# Patient Record
Sex: Female | Born: 1965 | Race: White | Hispanic: No | Marital: Married | Smoking: Never smoker
Health system: Southern US, Community
[De-identification: ages and names within clinical notes are randomized; demographics above are authoritative.]

## PROBLEM LIST (undated history)

## (undated) HISTORY — PX: CHOLECYSTECTOMY: SHX55

---

## 2010-12-27 ENCOUNTER — Other Ambulatory Visit (HOSPITAL_COMMUNITY)
Admission: RE | Admit: 2010-12-27 | Discharge: 2010-12-27 | Disposition: A | Discharge status: Home / Self Care | Payer: PRIVATE HEALTH INSURANCE | Source: Ambulatory Visit | Attending: Family Medicine | Admitting: Family Medicine

## 2010-12-27 DIAGNOSIS — Z124 Encounter for screening for malignant neoplasm of cervix: Secondary | ICD-10-CM | POA: Insufficient documentation

## 2010-12-28 ENCOUNTER — Other Ambulatory Visit (HOSPITAL_COMMUNITY): Payer: Self-pay | Admitting: Family Medicine

## 2010-12-28 DIAGNOSIS — Z1231 Encounter for screening mammogram for malignant neoplasm of breast: Secondary | ICD-10-CM

## 2011-01-15 ENCOUNTER — Ambulatory Visit (HOSPITAL_COMMUNITY): Payer: PRIVATE HEALTH INSURANCE

## 2011-02-05 ENCOUNTER — Ambulatory Visit (HOSPITAL_COMMUNITY)
Admission: RE | Admit: 2011-02-05 | Discharge: 2011-02-05 | Disposition: A | Discharge status: Home / Self Care | Payer: PRIVATE HEALTH INSURANCE | Source: Ambulatory Visit | Attending: Family Medicine | Admitting: Family Medicine

## 2011-02-05 DIAGNOSIS — Z1231 Encounter for screening mammogram for malignant neoplasm of breast: Secondary | ICD-10-CM | POA: Insufficient documentation

## 2012-02-18 ENCOUNTER — Other Ambulatory Visit: Payer: Self-pay | Admitting: Family Medicine

## 2012-02-18 DIAGNOSIS — Z1231 Encounter for screening mammogram for malignant neoplasm of breast: Secondary | ICD-10-CM

## 2012-03-06 ENCOUNTER — Ambulatory Visit (HOSPITAL_COMMUNITY)
Admission: RE | Admit: 2012-03-06 | Discharge: 2012-03-06 | Disposition: A | Discharge status: Home / Self Care | Payer: PRIVATE HEALTH INSURANCE | Source: Ambulatory Visit | Attending: Family Medicine | Admitting: Family Medicine

## 2012-03-06 DIAGNOSIS — Z1231 Encounter for screening mammogram for malignant neoplasm of breast: Secondary | ICD-10-CM | POA: Insufficient documentation

## 2012-10-21 ENCOUNTER — Other Ambulatory Visit (HOSPITAL_COMMUNITY)
Admission: RE | Admit: 2012-10-21 | Discharge: 2012-10-21 | Disposition: A | Discharge status: Home / Self Care | Payer: PRIVATE HEALTH INSURANCE | Source: Ambulatory Visit | Attending: Family Medicine | Admitting: Family Medicine

## 2012-10-21 DIAGNOSIS — Z113 Encounter for screening for infections with a predominantly sexual mode of transmission: Secondary | ICD-10-CM | POA: Insufficient documentation

## 2012-10-21 DIAGNOSIS — Z124 Encounter for screening for malignant neoplasm of cervix: Secondary | ICD-10-CM | POA: Insufficient documentation

## 2014-11-19 HISTORY — PX: BREAST BIOPSY: SHX20

## 2014-12-09 ENCOUNTER — Other Ambulatory Visit (HOSPITAL_COMMUNITY)
Admission: RE | Admit: 2014-12-09 | Discharge: 2014-12-09 | Disposition: A | Discharge status: Home / Self Care | Payer: 59 | Source: Ambulatory Visit | Attending: Family Medicine | Admitting: Family Medicine

## 2014-12-09 DIAGNOSIS — Z1151 Encounter for screening for human papillomavirus (HPV): Secondary | ICD-10-CM | POA: Diagnosis not present

## 2014-12-09 DIAGNOSIS — Z124 Encounter for screening for malignant neoplasm of cervix: Secondary | ICD-10-CM | POA: Insufficient documentation

## 2017-09-16 ENCOUNTER — Other Ambulatory Visit: Payer: Self-pay | Admitting: Obstetrics & Gynecology

## 2017-09-16 DIAGNOSIS — Z1231 Encounter for screening mammogram for malignant neoplasm of breast: Secondary | ICD-10-CM

## 2017-10-07 ENCOUNTER — Ambulatory Visit
Admission: RE | Admit: 2017-10-07 | Discharge: 2017-10-07 | Disposition: A | Discharge status: Home / Self Care | Payer: BLUE CROSS/BLUE SHIELD | Source: Ambulatory Visit | Attending: Obstetrics & Gynecology | Admitting: Obstetrics & Gynecology

## 2017-10-07 ENCOUNTER — Encounter: Payer: Self-pay | Admitting: Radiology

## 2017-10-07 DIAGNOSIS — Z1231 Encounter for screening mammogram for malignant neoplasm of breast: Secondary | ICD-10-CM

## 2017-10-14 ENCOUNTER — Inpatient Hospital Stay
Admission: RE | Admit: 2017-10-14 | Discharge: 2017-10-14 | Disposition: A | Discharge status: Home / Self Care | Payer: Self-pay | Source: Ambulatory Visit | Attending: *Deleted | Admitting: *Deleted

## 2017-10-14 ENCOUNTER — Other Ambulatory Visit: Payer: Self-pay | Admitting: *Deleted

## 2017-10-14 DIAGNOSIS — Z9289 Personal history of other medical treatment: Secondary | ICD-10-CM

## 2017-10-16 ENCOUNTER — Other Ambulatory Visit: Payer: Self-pay | Admitting: Obstetrics & Gynecology

## 2017-10-16 DIAGNOSIS — R928 Other abnormal and inconclusive findings on diagnostic imaging of breast: Secondary | ICD-10-CM

## 2017-10-16 DIAGNOSIS — N6489 Other specified disorders of breast: Secondary | ICD-10-CM

## 2017-10-17 ENCOUNTER — Ambulatory Visit
Admission: RE | Admit: 2017-10-17 | Discharge: 2017-10-17 | Disposition: A | Discharge status: Home / Self Care | Payer: BLUE CROSS/BLUE SHIELD | Source: Ambulatory Visit | Attending: Obstetrics & Gynecology | Admitting: Obstetrics & Gynecology

## 2017-10-17 DIAGNOSIS — N6489 Other specified disorders of breast: Secondary | ICD-10-CM

## 2017-10-17 DIAGNOSIS — R928 Other abnormal and inconclusive findings on diagnostic imaging of breast: Secondary | ICD-10-CM

## 2018-10-26 IMAGING — MG MM DIGITAL DIAGNOSTIC UNILAT*R* W/ TOMO W/ CAD
6 series · 6 of 14 positions shown · non-contrast
Comparison: Previous exam(s).

CLINICAL DATA: The patient was called back for a right breast
asymmetry.

EXAM:
2D DIGITAL DIAGNOSTIC RIGHT MAMMOGRAM WITH ADJUNCT TOMO
ULTRASOUND RIGHT BREAST

[R CC synth-2D]
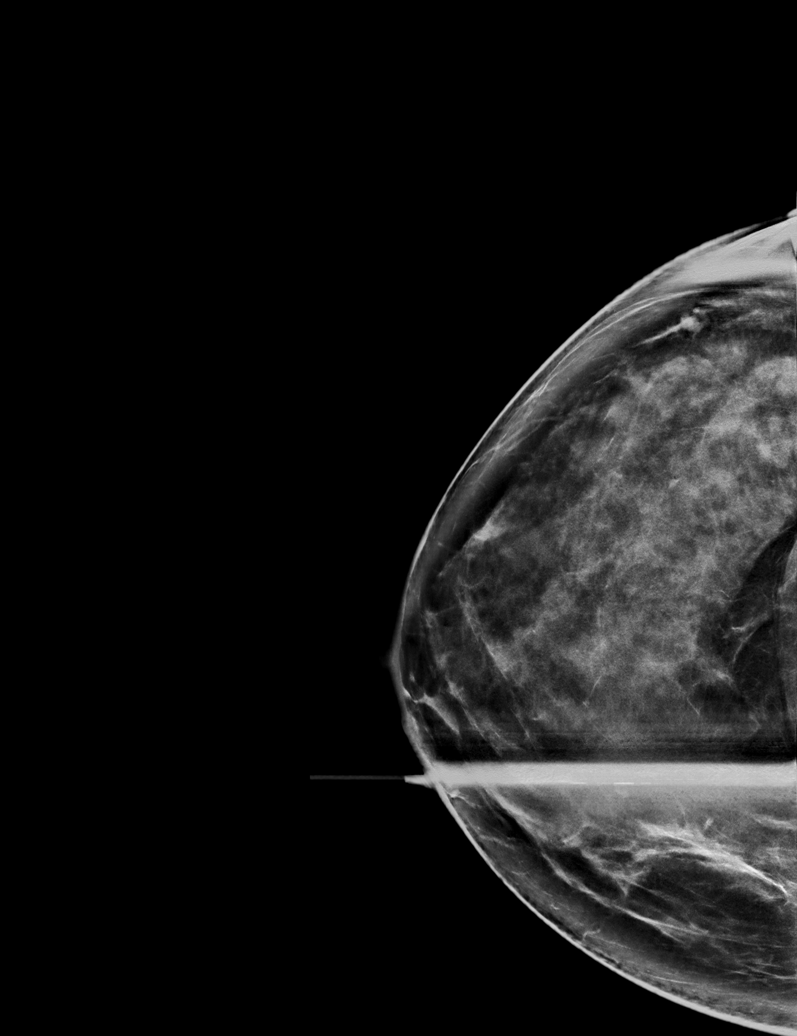

[R CC]
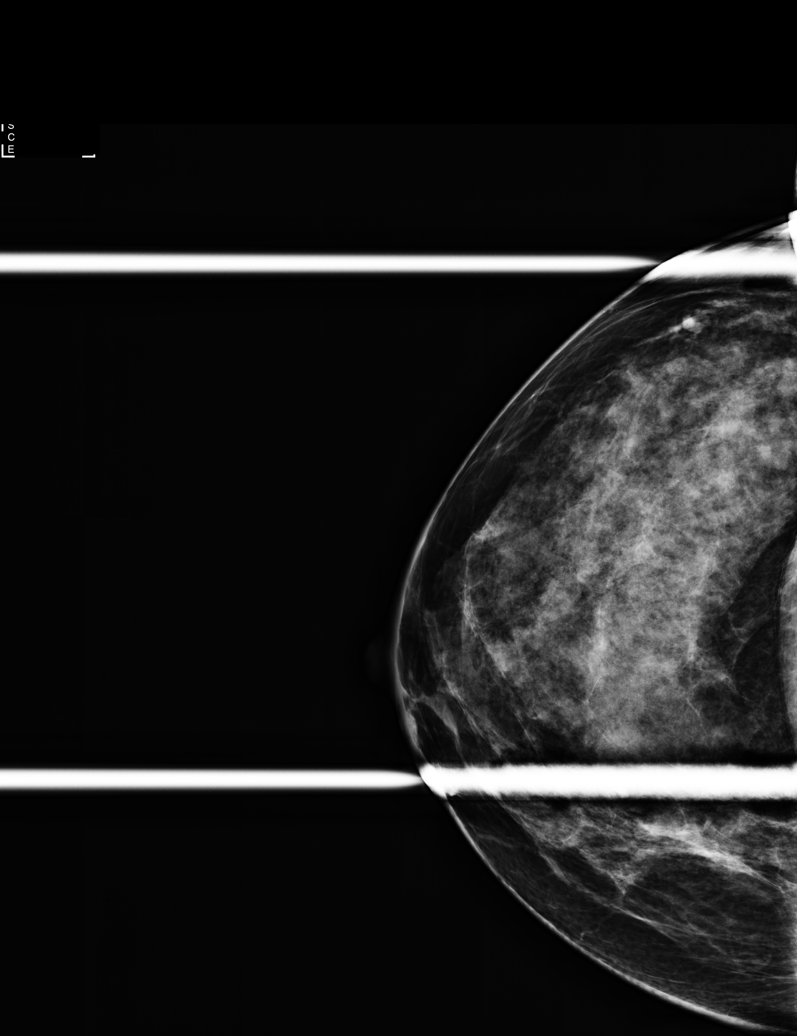

[R ML synth-2D]
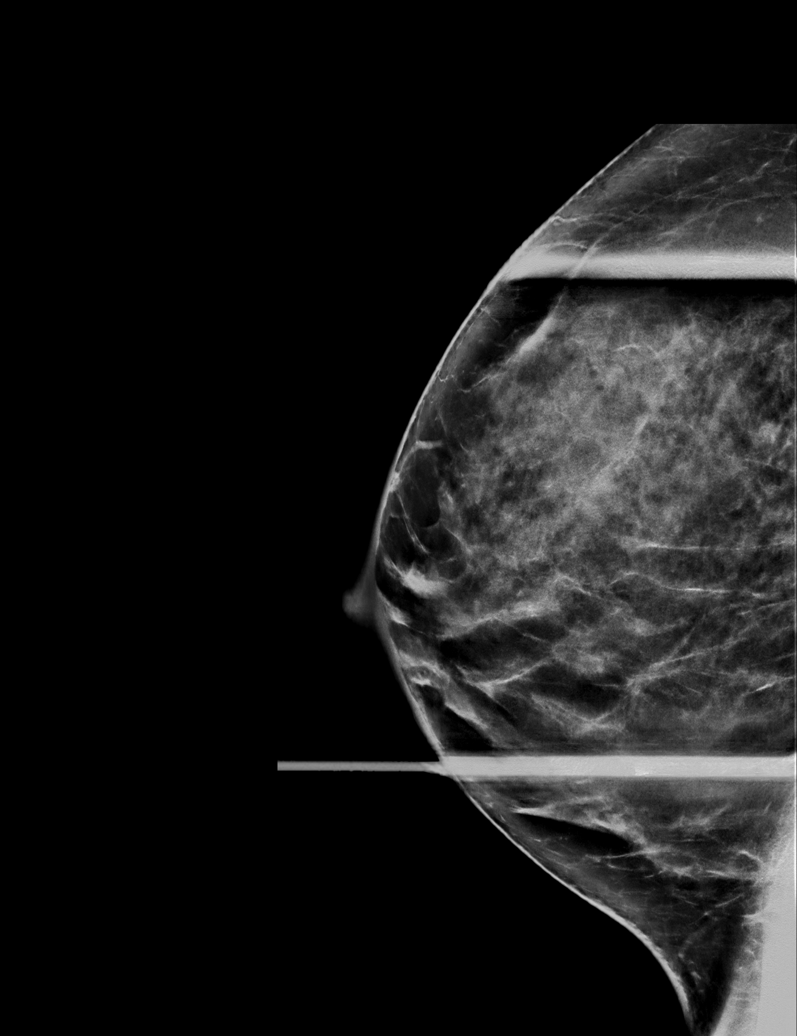

[R ML]
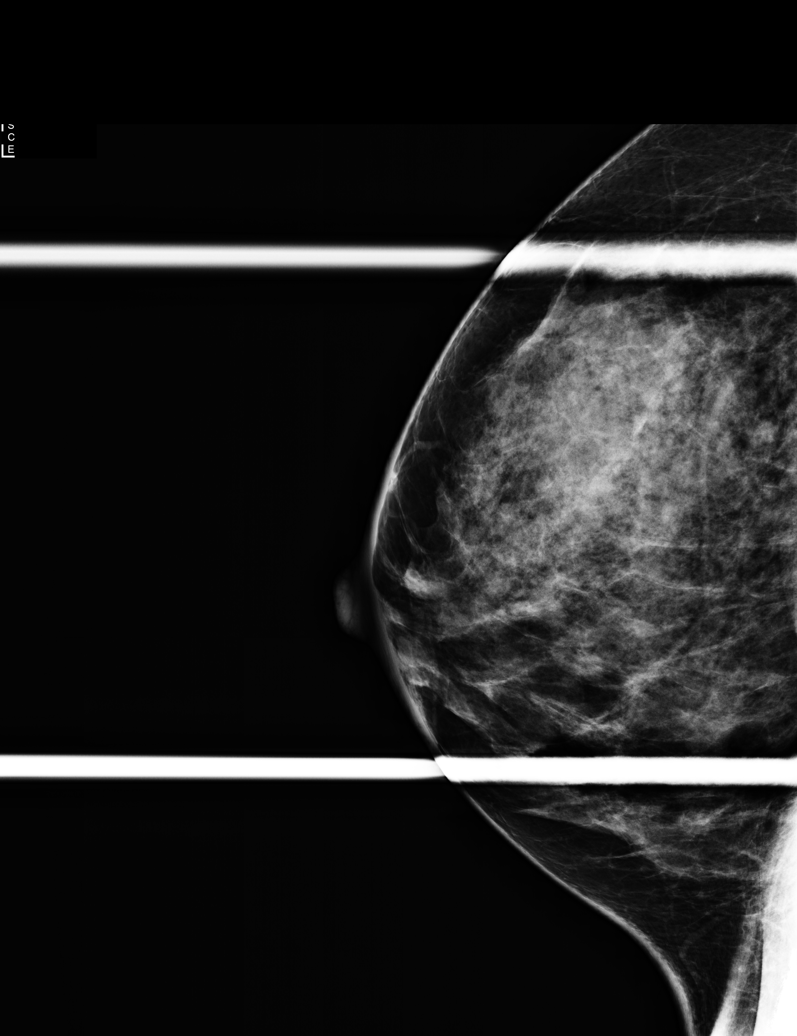

[R CC tomo · tomo slice 41/80.0]
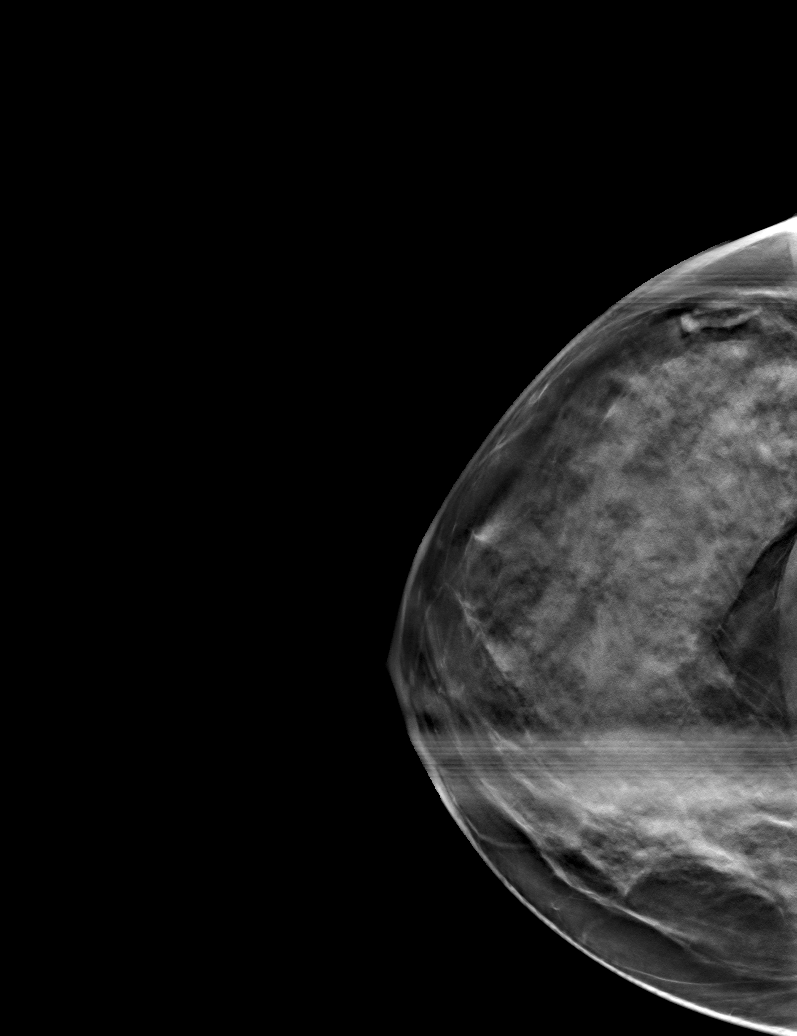

[R ML tomo · tomo slice 37/74.0]
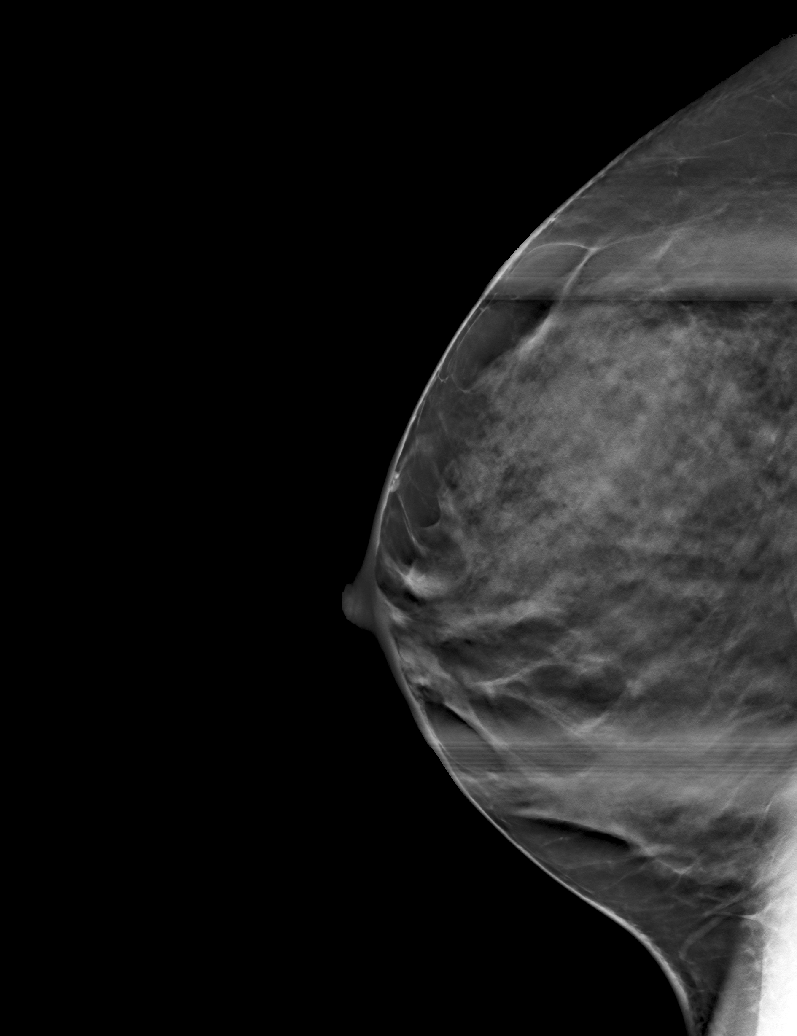

[6 of 14 positions shown; findings below may reference images not displayed]

ACR Breast Density Category c: The breast tissue is heterogeneously
dense, which may obscure small masses.
FINDINGS: The right breast asymmetry resolves on additional imaging.

On physical exam, no suspicious lumps are identified.

Targeted ultrasound is performed, showing no sonographic
abnormalities are identified in the upper outer right breast.
IMPRESSION: No mammographic or sonographic evidence of malignancy.

RECOMMENDATION:
Annual screening mammography.

I have discussed the findings and recommendations with the patient.
Results were also provided in writing at the conclusion of the
visit. If applicable, a reminder letter will be sent to the patient
regarding the next appointment.

BI-RADS CATEGORY  2: Benign.

## 2019-08-14 ENCOUNTER — Other Ambulatory Visit: Payer: Self-pay | Admitting: Obstetrics & Gynecology

## 2019-08-14 DIAGNOSIS — Z1231 Encounter for screening mammogram for malignant neoplasm of breast: Secondary | ICD-10-CM

## 2019-09-18 ENCOUNTER — Ambulatory Visit
Admission: RE | Admit: 2019-09-18 | Discharge: 2019-09-18 | Disposition: A | Discharge status: Home / Self Care | Payer: 59 | Source: Ambulatory Visit | Attending: Obstetrics & Gynecology | Admitting: Obstetrics & Gynecology

## 2019-09-18 DIAGNOSIS — Z1231 Encounter for screening mammogram for malignant neoplasm of breast: Secondary | ICD-10-CM | POA: Diagnosis not present

## 2019-09-22 ENCOUNTER — Other Ambulatory Visit: Payer: Self-pay | Admitting: Obstetrics & Gynecology

## 2019-09-22 DIAGNOSIS — N6320 Unspecified lump in the left breast, unspecified quadrant: Secondary | ICD-10-CM

## 2019-09-22 DIAGNOSIS — R928 Other abnormal and inconclusive findings on diagnostic imaging of breast: Secondary | ICD-10-CM

## 2019-09-30 ENCOUNTER — Ambulatory Visit
Admission: RE | Admit: 2019-09-30 | Discharge: 2019-09-30 | Disposition: A | Discharge status: Home / Self Care | Payer: 59 | Source: Ambulatory Visit | Attending: Obstetrics & Gynecology | Admitting: Obstetrics & Gynecology

## 2019-09-30 DIAGNOSIS — R928 Other abnormal and inconclusive findings on diagnostic imaging of breast: Secondary | ICD-10-CM

## 2019-09-30 DIAGNOSIS — N6320 Unspecified lump in the left breast, unspecified quadrant: Secondary | ICD-10-CM

## 2019-10-06 ENCOUNTER — Other Ambulatory Visit: Payer: Self-pay | Admitting: Obstetrics & Gynecology

## 2019-10-06 DIAGNOSIS — N6320 Unspecified lump in the left breast, unspecified quadrant: Secondary | ICD-10-CM

## 2020-02-11 ENCOUNTER — Ambulatory Visit: Payer: PRIVATE HEALTH INSURANCE | Attending: Internal Medicine

## 2020-02-11 DIAGNOSIS — Z23 Encounter for immunization: Secondary | ICD-10-CM

## 2020-02-11 NOTE — Progress Notes (Signed)
   Covid-19 Vaccination Clinic  Name:  Mary Cowan    MRN: [id number] DOB: 09/22/1966  02/11/2020  [name] was observed post Covid-19 immunization for 15 minutes without incident. She was provided with Vaccine Information Sheet and instruction to access the V-Safe system.   [name] was instructed to call 911 with any severe reactions post vaccine: Marland Kitchen Difficulty breathing  . Swelling of face and throat  . A fast heartbeat  . A bad rash all over body  . Dizziness and weakness   Immunizations Administered    Name Date Dose VIS Date Route   Moderna COVID-19 Vaccine 02/11/2020  8:51 AM 0.5 mL 10/20/2019 Intramuscular   Manufacturer: Moderna   Lot: [id number]   NDC: 58099-833-82

## 2020-03-15 ENCOUNTER — Ambulatory Visit: Payer: PRIVATE HEALTH INSURANCE | Attending: Internal Medicine

## 2020-03-15 DIAGNOSIS — Z23 Encounter for immunization: Secondary | ICD-10-CM

## 2020-03-15 NOTE — Progress Notes (Signed)
   Covid-19 Vaccination Clinic  Name:  Mary Cowan    MRN: [id number] DOB: October 28, 1966  03/15/2020  [name] was observed post Covid-19 immunization for 15 minutes without incident. She was provided with Vaccine Information Sheet and instruction to access the V-Safe system.   [name] was instructed to call 911 with any severe reactions post vaccine: Marland Kitchen Difficulty breathing  . Swelling of face and throat  . A fast heartbeat  . A bad rash all over body  . Dizziness and weakness   Immunizations Administered    Name Date Dose VIS Date Route   Moderna COVID-19 Vaccine 03/15/2020  9:18 AM 0.5 mL 10/2019 Intramuscular   Manufacturer: Moderna   Lot: [id number]   NDC: 52481-859-09

## 2020-05-20 ENCOUNTER — Ambulatory Visit
Admission: RE | Admit: 2020-05-20 | Discharge: 2020-05-20 | Disposition: A | Discharge status: Home / Self Care | Payer: 59 | Source: Ambulatory Visit | Attending: Obstetrics & Gynecology | Admitting: Obstetrics & Gynecology

## 2020-05-20 DIAGNOSIS — N6320 Unspecified lump in the left breast, unspecified quadrant: Secondary | ICD-10-CM | POA: Diagnosis not present

## 2021-04-10 ENCOUNTER — Inpatient Hospital Stay: Payer: 59 | Admitting: Anesthesiology

## 2021-04-10 ENCOUNTER — Encounter: Payer: Self-pay | Admitting: Emergency Medicine

## 2021-04-10 ENCOUNTER — Observation Stay
Admission: EM | Admit: 2021-04-10 | Discharge: 2021-04-11 | Disposition: A | Discharge status: Home / Self Care | Payer: 59 | Attending: Surgery | Admitting: Surgery

## 2021-04-10 ENCOUNTER — Other Ambulatory Visit: Payer: Self-pay

## 2021-04-10 ENCOUNTER — Emergency Department: Payer: 59

## 2021-04-10 ENCOUNTER — Inpatient Hospital Stay: Payer: 59

## 2021-04-10 ENCOUNTER — Encounter
Admission: EM | Disposition: A | Discharge status: Home / Self Care | Payer: Self-pay | Source: Home / Self Care | Attending: Emergency Medicine

## 2021-04-10 DIAGNOSIS — K8000 Calculus of gallbladder with acute cholecystitis without obstruction: Secondary | ICD-10-CM | POA: Diagnosis not present

## 2021-04-10 DIAGNOSIS — Z20822 Contact with and (suspected) exposure to covid-19: Secondary | ICD-10-CM | POA: Diagnosis not present

## 2021-04-10 DIAGNOSIS — R17 Unspecified jaundice: Secondary | ICD-10-CM | POA: Diagnosis not present

## 2021-04-10 DIAGNOSIS — R1011 Right upper quadrant pain: Secondary | ICD-10-CM | POA: Diagnosis present

## 2021-04-10 DIAGNOSIS — K819 Cholecystitis, unspecified: Secondary | ICD-10-CM | POA: Diagnosis present

## 2021-04-10 DIAGNOSIS — K8010 Calculus of gallbladder with chronic cholecystitis without obstruction: Secondary | ICD-10-CM | POA: Diagnosis not present

## 2021-04-10 LAB — CBC WITH DIFFERENTIAL/PLATELET
Abs Immature Granulocytes: 0.07 10*3/uL (ref 0.00–0.07)
Basophils Absolute: 0.1 10*3/uL (ref 0.0–0.1)
Basophils Relative: 1 %
Eosinophils Absolute: 0.2 10*3/uL (ref 0.0–0.5)
Eosinophils Relative: 1 %
HCT: 42.6 % (ref 36.0–46.0)
Hemoglobin: 14.6 g/dL (ref 12.0–15.0)
Immature Granulocytes: 0 %
Lymphocytes Relative: 8 %
Lymphs Abs: 1.5 10*3/uL (ref 0.7–4.0)
MCH: 30.2 pg (ref 26.0–34.0)
MCHC: 34.3 g/dL (ref 30.0–36.0)
MCV: 88 fL (ref 80.0–100.0)
Monocytes Absolute: 2.2 10*3/uL — ABNORMAL HIGH (ref 0.1–1.0)
Monocytes Relative: 11 %
Neutro Abs: 14.9 10*3/uL — ABNORMAL HIGH (ref 1.7–7.7)
Neutrophils Relative %: 79 %
Platelets: 538 10*3/uL — ABNORMAL HIGH (ref 150–400)
RBC: 4.84 MIL/uL (ref 3.87–5.11)
RDW: 12.9 % (ref 11.5–15.5)
WBC: 18.9 10*3/uL — ABNORMAL HIGH (ref 4.0–10.5)
nRBC: 0 % (ref 0.0–0.2)

## 2021-04-10 LAB — URINALYSIS, COMPLETE (UACMP) WITH MICROSCOPIC
Glucose, UA: NEGATIVE mg/dL
Hgb urine dipstick: NEGATIVE
Ketones, ur: 5 mg/dL — AB
Leukocytes,Ua: NEGATIVE
Nitrite: NEGATIVE
Protein, ur: >=300 mg/dL — AB
Specific Gravity, Urine: 1.031 — ABNORMAL HIGH (ref 1.005–1.030)
pH: 6 (ref 5.0–8.0)

## 2021-04-10 LAB — BASIC METABOLIC PANEL
Anion gap: 12 (ref 5–15)
BUN: 11 mg/dL (ref 6–20)
CO2: 27 mmol/L (ref 22–32)
Calcium: 9.7 mg/dL (ref 8.9–10.3)
Chloride: 99 mmol/L (ref 98–111)
Creatinine, Ser: 0.71 mg/dL (ref 0.44–1.00)
GFR, Estimated: >60 mL/min (ref >60)
Glucose, Bld: 116 mg/dL — ABNORMAL HIGH (ref 70–99)
Potassium: 4.2 mmol/L (ref 3.5–5.1)
Sodium: 138 mmol/L (ref 135–145)

## 2021-04-10 LAB — HEPATIC FUNCTION PANEL
ALT: 52 U/L — ABNORMAL HIGH (ref 0–44)
AST: 37 U/L (ref 15–41)
Albumin: 4.3 g/dL (ref 3.5–5.0)
Alkaline Phosphatase: 72 U/L (ref 38–126)
Bilirubin, Direct: 0.9 mg/dL — ABNORMAL HIGH (ref 0.0–0.2)
Indirect Bilirubin: 3 mg/dL — ABNORMAL HIGH (ref 0.3–0.9)
Total Bilirubin: 3.9 mg/dL — ABNORMAL HIGH (ref 0.3–1.2)
Total Protein: 8.5 g/dL — ABNORMAL HIGH (ref 6.5–8.1)

## 2021-04-10 LAB — RESP PANEL BY RT-PCR (FLU A&B, COVID) ARPGX2
Influenza A by PCR: NEGATIVE
Influenza B by PCR: NEGATIVE
SARS Coronavirus 2 by RT PCR: NEGATIVE

## 2021-04-10 SURGERY — CHOLECYSTECTOMY, ROBOT-ASSISTED, LAPAROSCOPIC
Anesthesia: General

## 2021-04-10 MED ORDER — ACETAMINOPHEN 10 MG/ML IV SOLN: 1000.0000 mg | Freq: Once | INTRAVENOUS | Status: DC | PRN

## 2021-04-10 MED ORDER — ONDANSETRON HCL 4 MG/2ML IJ SOLN: 4.0000 mg | Freq: Four times a day (QID) | INTRAMUSCULAR | Status: DC | PRN

## 2021-04-10 MED ORDER — ONDANSETRON HCL 4 MG/2ML IJ SOLN
INTRAMUSCULAR | Status: DC | PRN
  Administered 2021-04-10: 4 mg via INTRAVENOUS

## 2021-04-10 MED ORDER — GADOBUTROL 1 MMOL/ML IV SOLN
8.0000 mL | Freq: Once | INTRAVENOUS | Status: DC | PRN
  Filled 2021-04-10: qty 8

## 2021-04-10 MED ORDER — LACTATED RINGERS IV SOLN: INTRAVENOUS | Status: DC | PRN

## 2021-04-10 MED ORDER — ACETAMINOPHEN 10 MG/ML IV SOLN
INTRAVENOUS | Status: AC
  Filled 2021-04-10: qty 100

## 2021-04-10 MED ORDER — KETOROLAC TROMETHAMINE 30 MG/ML IJ SOLN: 30.0000 mg | Freq: Four times a day (QID) | INTRAMUSCULAR | Status: DC | PRN

## 2021-04-10 MED ORDER — OXYCODONE HCL 5 MG/5ML PO SOLN: 5.0000 mg | Freq: Once | ORAL | Status: DC | PRN

## 2021-04-10 MED ORDER — ONDANSETRON HCL 4 MG/2ML IJ SOLN
4.0000 mg | Freq: Once | INTRAMUSCULAR | Status: AC
  Administered 2021-04-10: 4 mg via INTRAVENOUS
  Filled 2021-04-10: qty 2

## 2021-04-10 MED ORDER — KETOROLAC TROMETHAMINE 30 MG/ML IJ SOLN: 30.0000 mg | Freq: Four times a day (QID) | INTRAMUSCULAR | Status: DC

## 2021-04-10 MED ORDER — FENTANYL CITRATE (PF) 100 MCG/2ML IJ SOLN
INTRAMUSCULAR | Status: AC
  Administered 2021-04-10: 25 ug via INTRAVENOUS
  Filled 2021-04-10: qty 2

## 2021-04-10 MED ORDER — FENTANYL CITRATE (PF) 100 MCG/2ML IJ SOLN
INTRAMUSCULAR | Status: AC
  Administered 2021-04-10: 50 ug via INTRAVENOUS
  Filled 2021-04-10: qty 2

## 2021-04-10 MED ORDER — ESMOLOL HCL 100 MG/10ML IV SOLN
INTRAVENOUS | Status: AC
  Filled 2021-04-10: qty 10

## 2021-04-10 MED ORDER — INDOCYANINE GREEN 25 MG IV SOLR
2.5000 mg | Freq: Once | INTRAVENOUS | Status: AC
  Administered 2021-04-10: 2.5 mg via INTRAVENOUS
  Filled 2021-04-10: qty 1

## 2021-04-10 MED ORDER — FENTANYL CITRATE (PF) 100 MCG/2ML IJ SOLN
INTRAMUSCULAR | Status: AC
  Filled 2021-04-10: qty 2

## 2021-04-10 MED ORDER — ROCURONIUM BROMIDE 100 MG/10ML IV SOLN
INTRAVENOUS | Status: DC | PRN
  Administered 2021-04-10: 30 mg via INTRAVENOUS

## 2021-04-10 MED ORDER — PROPOFOL 10 MG/ML IV BOLUS
INTRAVENOUS | Status: AC
  Filled 2021-04-10: qty 20

## 2021-04-10 MED ORDER — ROCURONIUM BROMIDE 10 MG/ML (PF) SYRINGE
PREFILLED_SYRINGE | INTRAVENOUS | Status: AC
  Filled 2021-04-10: qty 10

## 2021-04-10 MED ORDER — MIDAZOLAM HCL 2 MG/2ML IJ SOLN
INTRAMUSCULAR | Status: AC
  Filled 2021-04-10: qty 2

## 2021-04-10 MED ORDER — SUGAMMADEX SODIUM 200 MG/2ML IV SOLN
INTRAVENOUS | Status: DC | PRN
  Administered 2021-04-10: 200 mg via INTRAVENOUS

## 2021-04-10 MED ORDER — OXYCODONE HCL 5 MG PO TABS: 5.0000 mg | ORAL_TABLET | Freq: Once | ORAL | Status: DC | PRN

## 2021-04-10 MED ORDER — ONDANSETRON HCL 4 MG/2ML IJ SOLN
INTRAMUSCULAR | Status: AC
  Filled 2021-04-10: qty 2

## 2021-04-10 MED ORDER — EPHEDRINE SULFATE 50 MG/ML IJ SOLN
INTRAMUSCULAR | Status: DC | PRN
  Administered 2021-04-10: 10 mg via INTRAVENOUS

## 2021-04-10 MED ORDER — GADOBUTROL 1 MMOL/ML IV SOLN
7.5000 mL | Freq: Once | INTRAVENOUS | Status: AC | PRN
  Administered 2021-04-10: 7.5 mL via INTRAVENOUS
  Filled 2021-04-10: qty 7.5

## 2021-04-10 MED ORDER — LACTATED RINGERS IV BOLUS
1000.0000 mL | Freq: Once | INTRAVENOUS | Status: AC
  Administered 2021-04-10: 1000 mL via INTRAVENOUS

## 2021-04-10 MED ORDER — FENTANYL CITRATE (PF) 100 MCG/2ML IJ SOLN
INTRAMUSCULAR | Status: DC | PRN
  Administered 2021-04-10 (×2): 100 ug via INTRAVENOUS

## 2021-04-10 MED ORDER — EPHEDRINE 5 MG/ML INJ
INTRAVENOUS | Status: AC
  Filled 2021-04-10: qty 10

## 2021-04-10 MED ORDER — PROPOFOL 10 MG/ML IV BOLUS
INTRAVENOUS | Status: DC | PRN
  Administered 2021-04-10 (×3): 20 mg via INTRAVENOUS
  Administered 2021-04-10: 160 mg via INTRAVENOUS
  Administered 2021-04-10: 20 mg via INTRAVENOUS

## 2021-04-10 MED ORDER — BUPIVACAINE-EPINEPHRINE (PF) 0.25% -1:200000 IJ SOLN
INTRAMUSCULAR | Status: DC | PRN
  Administered 2021-04-10: 10 mL
  Administered 2021-04-10: 20 mL

## 2021-04-10 MED ORDER — HYDROMORPHONE HCL 1 MG/ML IJ SOLN
0.5000 mg | INTRAMUSCULAR | Status: DC | PRN
  Administered 2021-04-10 – 2021-04-11 (×3): 1 mg via INTRAVENOUS
  Filled 2021-04-10 (×3): qty 1

## 2021-04-10 MED ORDER — DEXAMETHASONE SODIUM PHOSPHATE 10 MG/ML IJ SOLN
INTRAMUSCULAR | Status: DC | PRN
  Administered 2021-04-10: 10 mg via INTRAVENOUS

## 2021-04-10 MED ORDER — LIDOCAINE HCL (CARDIAC) PF 100 MG/5ML IV SOSY
PREFILLED_SYRINGE | INTRAVENOUS | Status: DC | PRN
  Administered 2021-04-10: 60 mg via INTRAVENOUS

## 2021-04-10 MED ORDER — DEXMEDETOMIDINE (PRECEDEX) IN NS 20 MCG/5ML (4 MCG/ML) IV SYRINGE
PREFILLED_SYRINGE | INTRAVENOUS | Status: DC | PRN
  Administered 2021-04-10 (×3): 4 ug via INTRAVENOUS

## 2021-04-10 MED ORDER — ONDANSETRON 4 MG PO TBDP: 4.0000 mg | ORAL_TABLET | Freq: Four times a day (QID) | ORAL | Status: DC | PRN

## 2021-04-10 MED ORDER — FENTANYL CITRATE (PF) 100 MCG/2ML IJ SOLN
50.0000 ug | Freq: Once | INTRAMUSCULAR | Status: AC
  Administered 2021-04-10: 50 ug via INTRAVENOUS

## 2021-04-10 MED ORDER — MIDAZOLAM HCL 2 MG/2ML IJ SOLN
INTRAMUSCULAR | Status: DC | PRN
  Administered 2021-04-10: 2 mg via INTRAVENOUS

## 2021-04-10 MED ORDER — DEXAMETHASONE SODIUM PHOSPHATE 10 MG/ML IJ SOLN
INTRAMUSCULAR | Status: AC
  Filled 2021-04-10: qty 1

## 2021-04-10 MED ORDER — SODIUM CHLORIDE 0.9 % IV SOLN: INTRAVENOUS | Status: DC

## 2021-04-10 MED ORDER — ESMOLOL HCL 100 MG/10ML IV SOLN
INTRAVENOUS | Status: DC | PRN
  Administered 2021-04-10 (×2): 2.5 mg via INTRAVENOUS

## 2021-04-10 MED ORDER — PHENYLEPHRINE HCL (PRESSORS) 10 MG/ML IV SOLN
INTRAVENOUS | Status: DC | PRN
  Administered 2021-04-10: 100 ug via INTRAVENOUS

## 2021-04-10 MED ORDER — PIPERACILLIN-TAZOBACTAM 3.375 G IVPB
3.3750 g | Freq: Three times a day (TID) | INTRAVENOUS | Status: DC
  Administered 2021-04-10 – 2021-04-11 (×2): 3.375 g via INTRAVENOUS
  Filled 2021-04-10: qty 50

## 2021-04-10 MED ORDER — PIPERACILLIN-TAZOBACTAM 3.375 G IVPB
INTRAVENOUS | Status: AC
  Filled 2021-04-10: qty 50

## 2021-04-10 MED ORDER — OXYCODONE HCL 5 MG PO TABS
5.0000 mg | ORAL_TABLET | ORAL | Status: DC | PRN
  Administered 2021-04-11 (×2): 10 mg via ORAL
  Filled 2021-04-10 (×2): qty 2

## 2021-04-10 MED ORDER — LIDOCAINE HCL (PF) 2 % IJ SOLN
INTRAMUSCULAR | Status: AC
  Filled 2021-04-10: qty 2

## 2021-04-10 MED ORDER — FENTANYL CITRATE (PF) 100 MCG/2ML IJ SOLN
25.0000 ug | INTRAMUSCULAR | Status: DC | PRN
  Administered 2021-04-10 (×3): 25 ug via INTRAVENOUS

## 2021-04-10 MED ORDER — SUCCINYLCHOLINE CHLORIDE 200 MG/10ML IV SOSY
PREFILLED_SYRINGE | INTRAVENOUS | Status: AC
  Filled 2021-04-10: qty 10

## 2021-04-10 MED ORDER — ONDANSETRON HCL 4 MG/2ML IJ SOLN: 4.0000 mg | Freq: Once | INTRAMUSCULAR | Status: DC | PRN

## 2021-04-10 MED ORDER — ACETAMINOPHEN 10 MG/ML IV SOLN
INTRAVENOUS | Status: DC | PRN
  Administered 2021-04-10: 1000 mg via INTRAVENOUS

## 2021-04-10 SURGICAL SUPPLY — 46 items
CANISTER SUCT 1200ML W/VALVE (MISCELLANEOUS) ×3 IMPLANT
CANNULA CAP OBTURATR AIRSEAL 8 (CAP) ×3 IMPLANT
CHLORAPREP W/TINT 26 (MISCELLANEOUS) ×3 IMPLANT
CLIP VESOLOCK LG 6/CT PURPLE (CLIP) ×3 IMPLANT
COVER TIP SHEARS 8 DVNC (MISCELLANEOUS) ×2 IMPLANT
COVER TIP SHEARS 8MM DA VINCI (MISCELLANEOUS) ×1
COVER WAND RF STERILE (DRAPES) ×3 IMPLANT
DECANTER SPIKE VIAL GLASS SM (MISCELLANEOUS) ×3 IMPLANT
DEFOGGER SCOPE WARMER CLEARIFY (MISCELLANEOUS) ×3 IMPLANT
DERMABOND ADVANCED (GAUZE/BANDAGES/DRESSINGS) ×1
DERMABOND ADVANCED .7 DNX12 (GAUZE/BANDAGES/DRESSINGS) ×2 IMPLANT
DRAPE ARM DVNC X/XI (DISPOSABLE) ×8 IMPLANT
DRAPE COLUMN DVNC XI (DISPOSABLE) ×2 IMPLANT
DRAPE DA VINCI XI ARM (DISPOSABLE) ×4
DRAPE DA VINCI XI COLUMN (DISPOSABLE) ×1
ELECT CAUTERY BLADE 6.4 (BLADE) ×3 IMPLANT
GLOVE SURG ORTHO LTX SZ7.5 (GLOVE) ×6 IMPLANT
GOWN STRL REUS W/ TWL LRG LVL3 (GOWN DISPOSABLE) ×8 IMPLANT
GOWN STRL REUS W/TWL LRG LVL3 (GOWN DISPOSABLE) ×4
GRASPER SUT TROCAR 14GX15 (MISCELLANEOUS) IMPLANT
INFUSOR MANOMETER BAG 3000ML (MISCELLANEOUS) IMPLANT
IRRIGATION STRYKERFLOW (MISCELLANEOUS) IMPLANT
IRRIGATOR STRYKERFLOW (MISCELLANEOUS)
IRRIGATOR SUCT 8 DISP DVNC XI (IRRIGATION / IRRIGATOR) IMPLANT
IRRIGATOR SUCTION 8MM XI DISP (IRRIGATION / IRRIGATOR)
IV NS IRRIG 3000ML ARTHROMATIC (IV SOLUTION) IMPLANT
KIT PINK PAD W/HEAD ARE REST (MISCELLANEOUS) ×3
KIT PINK PAD W/HEAD ARM REST (MISCELLANEOUS) ×2 IMPLANT
KIT TURNOVER KIT A (KITS) ×3 IMPLANT
LABEL OR SOLS (LABEL) ×3 IMPLANT
MANIFOLD NEPTUNE II (INSTRUMENTS) ×3 IMPLANT
NEEDLE HYPO 22GX1.5 SAFETY (NEEDLE) ×3 IMPLANT
NEEDLE INSUFFLATION 14GA 120MM (NEEDLE) IMPLANT
NS IRRIG 500ML POUR BTL (IV SOLUTION) ×3 IMPLANT
PACK LAP CHOLECYSTECTOMY (MISCELLANEOUS) ×3 IMPLANT
PENCIL ELECTRO HAND CTR (MISCELLANEOUS) ×3 IMPLANT
POUCH SPECIMEN RETRIEVAL 10MM (ENDOMECHANICALS) ×3 IMPLANT
SEAL CANN UNIV 5-8 DVNC XI (MISCELLANEOUS) ×6 IMPLANT
SEAL XI 5MM-8MM UNIVERSAL (MISCELLANEOUS) ×3
SET TUBE FILTERED XL AIRSEAL (SET/KITS/TRAYS/PACK) ×3 IMPLANT
SOLUTION ELECTROLUBE (MISCELLANEOUS) ×3 IMPLANT
SUT MNCRL 4-0 (SUTURE) ×1
SUT MNCRL 4-0 27XMFL (SUTURE) ×2
SUT VICRYL 0 AB UR-6 (SUTURE) ×3 IMPLANT
SUTURE MNCRL 4-0 27XMF (SUTURE) ×2 IMPLANT
TROCAR Z-THREAD FIOS 11X100 BL (TROCAR) ×3 IMPLANT

## 2021-04-10 NOTE — ED Provider Notes (Signed)
[facility] Emergency Department Provider Note   ____________________________________________   Event Date/Time   First MD Initiated Contact with Patient 04/10/21 1309     (approximate)  I have reviewed the triage vital signs and the nursing notes.   HISTORY  Chief Complaint Emesis    HPI Mary Cowan is a 55 y.o. female with no significant past medical history who presents to the ED complaining of nausea and vomiting.  Patient reports that she has been consistently vomiting over the past 2 days, unable to keep down any solid food and only able to tolerate small amounts of liquids.  She initially thought she could have food poisoning, but decided to seek care when symptoms persisted through the weekend.  She has not had any associated diarrhea, does describe soreness in her upper abdomen which she attributes to frequent vomiting.  She reports feeling feverish with chills, but has not taken her temperature at home.  She was initially evaluated at [facility] clinic, sent to the ED for further evaluation due to elevated WBC.  Patient denies any history of abdominal surgeries, has never been told of any gallstones.  She denies any dysuria, hematuria, or flank pain.        History reviewed. No pertinent past medical history.  There are no problems to display for this patient.   Past Surgical History:  Procedure Laterality Date  . BREAST BIOPSY Left 2016   neg    Prior to Admission medications   Not on File    Allergies Cefuroxime axetil, Cetirizine, Nickel, and Esomeprazole magnesium  Family History  Problem Relation Age of Onset  . Breast cancer Paternal Aunt 68    Social History    Review of Systems  Constitutional: Positive for subjective fever/chills Eyes: No visual changes. ENT: No sore throat. Cardiovascular: Denies chest pain. Respiratory: Denies shortness of breath. Gastrointestinal: Positive for abdominal pain, nausea, and vomiting.   No diarrhea.  No constipation. Genitourinary: Negative for dysuria. Musculoskeletal: Negative for back pain. Skin: Negative for rash. Neurological: Negative for headaches, focal weakness or numbness.  ____________________________________________   PHYSICAL EXAM:  VITAL SIGNS: ED Triage Vitals  Enc Vitals Group     BP 04/10/21 1055 (!) 143/107     Pulse Rate 04/10/21 1055 (!) 113     Resp 04/10/21 1055 20     Temp 04/10/21 1055 98.7 F (37.1 C)     Temp Source 04/10/21 1055 Oral     SpO2 04/10/21 1055 94 %     Weight 04/10/21 1052 182 lb (82.6 kg)     Height 04/10/21 1052 5\' 2"  (1.575 m)     Head Circumference --      Peak Flow --      Pain Score 04/10/21 1052 2     Pain Loc --      Pain Edu? --      Excl. in GC? --     Constitutional: Alert and oriented. Eyes: Conjunctivae are normal. Head: Atraumatic. Nose: No congestion/rhinnorhea. Mouth/Throat: Mucous membranes are moist. Neck: Normal ROM Cardiovascular: Normal rate, regular rhythm. Grossly normal heart sounds. Respiratory: Normal respiratory effort.  No retractions. Lungs CTAB. Gastrointestinal: Soft and tender to palpation in the right upper quadrant with no rebound or guarding. No distention. Genitourinary: deferred Musculoskeletal: No lower extremity tenderness nor edema. Neurologic:  Normal speech and language. No gross focal neurologic deficits are appreciated. Skin:  Skin is warm, dry and intact. No rash noted. Psychiatric: Mood and affect are normal.  Speech and behavior are normal.  ____________________________________________   LABS (all labs ordered are listed, but only abnormal results are displayed)  Labs Reviewed  CBC WITH DIFFERENTIAL/PLATELET - Abnormal; Notable for the following components:      Result Value   WBC 18.9 (*)    Platelets 538 (*)    Neutro Abs 14.9 (*)    Monocytes Absolute 2.2 (*)    All other components within normal limits  BASIC METABOLIC PANEL - Abnormal; Notable for the  following components:   Glucose, Bld 116 (*)    All other components within normal limits  HEPATIC FUNCTION PANEL - Abnormal; Notable for the following components:   Total Protein 8.5 (*)    ALT 52 (*)    Total Bilirubin 3.9 (*)    Bilirubin, Direct 0.9 (*)    Indirect Bilirubin 3.0 (*)    All other components within normal limits  URINALYSIS, COMPLETE (UACMP) WITH MICROSCOPIC - Abnormal; Notable for the following components:   Color, Urine AMBER (*)    APPearance HAZY (*)    Specific Gravity, Urine 1.031 (*)    Bilirubin Urine SMALL (*)    Ketones, ur 5 (*)    Protein, ur >=300 (*)    Bacteria, UA FEW (*)    All other components within normal limits  URINE CULTURE  RESP PANEL BY RT-PCR (FLU A&B, COVID) [id number]   ____________________________________________  EKG  ED ECG REPORT I, Chesley Noon, the attending physician, personally viewed and interpreted this ECG.   Date: 04/10/2021  EKG Time: 10:57  Rate: 115  Rhythm: sinus tachycardia  Axis: Normal  Intervals:none  ST&T Change: None   PROCEDURES  Procedure(s) performed (including Critical Care):  Procedures   ____________________________________________   INITIAL IMPRESSION / ASSESSMENT AND PLAN / ED COURSE       55 year old female with no significant past medical history presents to the ED with 2 days of persistent nausea and vomiting, inability to tolerate p.o., and upper abdominal pain.  Patient's pain is reproducible with palpation of her right upper quadrant, labs reviewed from [facility] and are significant for leukocytosis along with elevated bilirubin.  We will further assess for cholecystitis and choledocholithiasis with right upper quadrant ultrasound, transaminases and lipase within normal limits on outside labs.  We will repeat hepatic function panel to determine if this is a direct or indirect bilirubinemia.  Patient's UA at [facility] was borderline for infection with positive nitrites,  although patient denies any urinary symptoms and this would not fit with her presentation.  We will hydrate with IV fluids and treat symptomatically with Zofran.  Right upper quadrant ultrasound reviewed by me and shows gallstones with mild thickening of gallbladder wall concerning for cholecystitis.  Patient does have mild elevation in bilirubin but this is an indirect bilirubinemia, making choledocholithiasis less likely.  Case discussed with Dr. Claudine Mouton of general surgery and patient evaluated by surgical team.  They will plan for admission to their service, patient declines pain medication at this time.      ____________________________________________   FINAL CLINICAL IMPRESSION(S) / ED DIAGNOSES  Final diagnoses:  RUQ pain  Cholecystitis     ED Discharge Orders    None       Note:  This document was prepared using Dragon voice recognition software and may include unintentional dictation errors.   Chesley Noon, MD 04/10/21 [phone]

## 2021-04-10 NOTE — ED Triage Notes (Signed)
Pt states started having N/V on Saturday. Pt states initially thought she had food poisoning. Pt c/o RUQ abd pain. Pt states sent by [facility] due to elevated WBC.

## 2021-04-10 NOTE — Anesthesia Procedure Notes (Signed)
Procedure Name: Intubation Date/Time: 04/10/2021 6:41 PM Performed by: Lendon Colonel, CRNA Pre-anesthesia Checklist: Patient identified, Patient being monitored, Timeout performed, Emergency Drugs available and Suction available Patient Re-evaluated:Patient Re-evaluated prior to induction Oxygen Delivery Method: Circle system utilized Preoxygenation: Pre-oxygenation with 100% oxygen Induction Type: IV induction and Rapid sequence Laryngoscope Size: Mac and 3 Grade View: Grade I Tube type: Oral Tube size: 6.5 mm Number of attempts: 1 Airway Equipment and Method: Stylet Placement Confirmation: ETT inserted through vocal cords under direct vision,  positive ETCO2 and breath sounds checked- equal and bilateral Secured at: 20 cm Tube secured with: Tape Dental Injury: Teeth and Oropharynx as per pre-operative assessment

## 2021-04-10 NOTE — ED Notes (Signed)
Surgeon at bedside , surgical consent form at bedside

## 2021-04-10 NOTE — Anesthesia Postprocedure Evaluation (Signed)
Anesthesia Post Note  Patient: Mary Cowan  Procedure(s) Performed: XI ROBOTIC ASSISTED LAPAROSCOPIC CHOLECYSTECTOMY (N/A ) INDOCYANINE GREEN FLUORESCENCE IMAGING (ICG)  Patient location during evaluation: PACU Anesthesia Type: General Level of consciousness: awake and alert Pain management: pain level controlled Vital Signs Assessment: post-procedure vital signs reviewed and stable Respiratory status: spontaneous breathing, nonlabored ventilation, respiratory function stable and patient connected to nasal cannula oxygen Cardiovascular status: blood pressure returned to baseline and stable Postop Assessment: no apparent nausea or vomiting Anesthetic complications: no   No complications documented.   Last Vitals:  Vitals:   04/10/21 2100 04/10/21 2105  BP: 129/81   Pulse: 91 99  Resp: 18 15  Temp:    SpO2: 92% (!) 89%    Last Pain:  Vitals:   04/10/21 2100  TempSrc:   PainSc: 6                  Corinda Gubler

## 2021-04-10 NOTE — ED Notes (Signed)
NPO stated  Sips of water this morning , Last meal 2-days ago

## 2021-04-10 NOTE — ED Notes (Signed)
Transportation requested  

## 2021-04-10 NOTE — Anesthesia Preprocedure Evaluation (Signed)
Anesthesia Evaluation  Patient identified by MRN, date of birth, ID band Patient awake  General Assessment Comment:Cholelithiasis; patient vomiting for past two days; last vomit at 15hrs prior  Reviewed: Allergy & Precautions, NPO status , Patient's Chart, lab work & pertinent test results  History of Anesthesia Complications Negative for: history of anesthetic complications  Airway Mallampati: III  TM Distance: >3 FB Neck ROM: Full    Dental no notable dental hx. (+) Teeth Intact   Pulmonary neg pulmonary ROS, neg sleep apnea, neg COPD, Patient abstained from smoking.Not current smoker,    Pulmonary exam normal breath sounds clear to auscultation       Cardiovascular Exercise Tolerance: Good METS(-) hypertension(-) CAD and (-) Past MI negative cardio ROS  (-) dysrhythmias  Rhythm:Regular Rate:Normal - Systolic murmurs    Neuro/Psych negative neurological ROS  negative psych ROS   GI/Hepatic neg GERD  ,(+)     substance abuse  alcohol use, 1-2 glass of vodka daily   Endo/Other  neg diabetes  Renal/GU negative Renal ROS     Musculoskeletal   Abdominal   Peds  Hematology Thrombocytosis; has had BM biopsy before with no diagnoses.   Anesthesia Other Findings History reviewed. No pertinent past medical history.  Reproductive/Obstetrics                             Anesthesia Physical Anesthesia Plan  ASA: II  Anesthesia Plan: General   Post-op Pain Management:    Induction: Intravenous and Rapid sequence  PONV Risk Score and Plan: 4 or greater and Ondansetron, Dexamethasone and Midazolam  Airway Management Planned: Oral ETT  Additional Equipment: None  Intra-op Plan:   Post-operative Plan: Extubation in OR  Informed Consent: I have reviewed the patients History and Physical, chart, labs and discussed the procedure including the risks, benefits and alternatives for the proposed  anesthesia with the patient or authorized representative who has indicated his/her understanding and acceptance.     Dental advisory given  Plan Discussed with: CRNA and Surgeon  Anesthesia Plan Comments: (Discussed risks of anesthesia with patient, including PONV, sore throat, lip/dental damage. Rare risks discussed as well, such as cardiorespiratory and neurological sequelae. Patient understands.)        Anesthesia Quick Evaluation

## 2021-04-10 NOTE — Transfer of Care (Signed)
Immediate Anesthesia Transfer of Care Note  Patient: Mary Cowan  Procedure(s) Performed: XI ROBOTIC ASSISTED LAPAROSCOPIC CHOLECYSTECTOMY (N/A ) INDOCYANINE GREEN FLUORESCENCE IMAGING (ICG)  Patient Location: PACU  Anesthesia Type:General  Level of Consciousness: sedated and patient cooperative  Airway & Oxygen Therapy: Patient Spontanous Breathing and Patient connected to nasal cannula oxygen  Post-op Assessment: Report given to RN and Post -op Vital signs reviewed and stable  Post vital signs: Reviewed and stable  Last Vitals:  Vitals Value Taken Time  BP 146/88 04/10/21 2030  Temp 36 C 04/10/21 2027  Pulse 96 04/10/21 2032  Resp 20 04/10/21 2032  SpO2 98 % 04/10/21 2032  Vitals shown include unvalidated device data.  Last Pain:  Vitals:   04/10/21 1724  TempSrc: Oral  PainSc: 0-No pain         Complications: No complications documented.

## 2021-04-10 NOTE — Op Note (Signed)
Robotic cholecystectomy  Pre-operative Diagnosis: Acute calculus cholecystitis  Post-operative Diagnosis:  Same.  Procedure: Robotic assisted laparoscopic cholecystectomy.  Surgeon: Campbell Lerner, M.D., FACS  Anesthesia: General. with endotracheal tube  Findings: Edematous gallbladder, distended tense.  Estimated Blood Loss: 25 mL         Drains: None         Specimens: Gallbladder           Complications: none  Procedure Details  The patient was seen again in the Holding Room.  2.5 mg dose of ICG was administered intravenously.   The benefits, complications, treatment options, risks and expected outcomes were again reviewed with the patient. The likelihood of improving the patient's symptoms with return to their baseline status is good.  The patient and/or family concurred with the proposed plan, giving informed consent, again alternatives reviewed.  The patient was taken to Operating Room, identified, and the procedure verified as robotic assisted laparoscopic cholecystectomy.  Prior to the induction of general anesthesia, antibiotic prophylaxis was administered. VTE prophylaxis was in place. General endotracheal anesthesia was then administered and tolerated well. The patient was positioned in the supine position.  After the induction, the abdomen was prepped with Chloraprep and draped in the sterile fashion.  A Time Out was held and the above information confirmed.  Right infra-umbilical local infiltration with quarter percent Marcaine with epinephrine is utilized.  Made a 12 mm incision on the right periumbilical site, I advanced an optical 53mm port under direct visualization into the peritoneal cavity.  Once the peritoneum was penetrated, insufflation was initiated.  The trocar was then advanced into the abdominal cavity under direct visualization. Pneumoperitoneum was then continued with Air seal utilizing CO2 at 15 mmHg or less and tolerated well without any adverse changes  in the patient's vital signs.  Two 8.5-mm ports were placed in the left lower quadrant and laterally, and one to the right lower quadrant, all under direct vision. All skin incisions  were infiltrated with a local anesthetic agent before making the incision and placing the trocars.  The patient was positioned  in reverse Trendelenburg, tilted the patient's left side down.  Da Vinci XI robot was then positioned on to the patient's left side, and docked.  The gallbladder was identified, the fundus was manipulated and retracted cephalad via the arm 4 Prograsp as it could not be grasped due to its edema and distention. Adhesions were lysed with scissors and cautery.  The infundibulum was identified and manipulated/grasped and retracted laterally, exposing the peritoneum overlying the triangle of Calot. This was then opened and dissected using cautery & scissors. An extended critical view of the cystic duct was obtained, aided by the ICG via FireFly which enabled ready visualization of the common ductal location.    The cystic duct was clearly identified and dissected to isolation, and the cystic duct was triple clipped and divided with scissors, as close to the gallbladder neck as feasible, thus leaving two on the remaining stump.  The cystic artery is managed/sealed with bipolar and divided with monopolar scissors.   The gallbladder was taken from the gallbladder fossa in a retrograde fashion with the electrocautery.  Eventually it was drained inadvertently during the final dissection.  The gallbladder was removed and placed in an Endocatch bag.  The liver bed is inspected. Hemostasis was confirmed.  The robot was undocked and moved away from the operative field. Nearly 3 L of sterile saline irrigation was utilized and was aspirated clear.  The gallbladder  and Endocatch sac were then removed through the infraumbilical port site, some dilation necessary to accommodate its size.   Inspection of the right  upper quadrant was performed. No bleeding, bile duct injury or leak, or bowel injury was noted. The infra-umbilical port site fascia was closed with interrumpted 0 Vicryl sutures using PMI/cone under direct visualization. Pneumoperitoneum was released and ports removed.  4-0 subcuticular Monocryl was used to close the skin. Dermabond was  applied.  The patient was then extubated and brought to the recovery room in stable condition. Sponge, lap, and needle counts were correct at closure and at the conclusion of the case.               Campbell Lerner, M.D., [facility] 04/10/2021 8:12 PM

## 2021-04-10 NOTE — ED Notes (Signed)
Transport at Community education officer at bedside

## 2021-04-10 NOTE — H&P (Signed)
[facility] SURGICAL HISTORY & PHYSICAL (cpt [phone])  HISTORY OF PRESENT ILLNESS (HPI):  55 y.o. female presented to [facility] ED todday for abdominal pain. Patient reports the acute onset of nausea and emesis over the weekend which she initially thought was food poisoning. Dinner prior to the onset was crab cakes, but her husband ate the same thing without issues. She does endorse associated upper abdominal soreness which she attributes to the the frequent vomiting, but does remain more tender in the RUQ. No fever, chills, cough, CP, SOB, urinary changes, or bowel changes. No previous history of similar. No intra-abdominal surgeries. She initially presented to [facility] clinic but was referred to the ED. Work up in the ED was concerning for leukocytosis to 18.9K, hyperbilirubinemia to 3.9 (indirect 3.0), and [place] was concerning for cholelithiasis and possible cholecystitis.   General surgery is consulted by emergency medicine physician Dr Chesley Noon, MD for evaluation and management of cholecystitis.   PAST MEDICAL HISTORY (PMH):  History reviewed. No pertinent past medical history.  Reviewed. Otherwise negative.   PAST SURGICAL HISTORY (PSH):  Past Surgical History:  Procedure Laterality Date  . BREAST BIOPSY Left 2016   neg    Reviewed. Otherwise negative.   MEDICATIONS:  Prior to Admission medications   Not on File     ALLERGIES:  Allergies  Allergen Reactions  . Cefuroxime Axetil     Other reaction(s): GI Intolerance, Other (See Comments) C/o having stomach flu s/s   . Cetirizine Swelling    Throat was swelling  . Nickel Rash  . Esomeprazole Magnesium Diarrhea     SOCIAL HISTORY:  Social History   Socioeconomic History  . Marital status: Married    Spouse name: Not on file  . Number of children: Not on file  . Years of education: Not on file  . Highest education level: Not on file  Occupational History  . Not on file  Tobacco Use  . Smoking status:  Not on file  . Smokeless tobacco: Not on file  Substance and Sexual Activity  . Alcohol use: Not on file  . Drug use: Not on file  . Sexual activity: Not on file  Other Topics Concern  . Not on file  Social History Narrative  . Not on file   Social Determinants of Health   Financial Resource Strain: Not on file  Food Insecurity: Not on file  Transportation Needs: Not on file  Physical Activity: Not on file  Stress: Not on file  Social Connections: Not on file  Intimate Partner Violence: Not on file     FAMILY HISTORY:  Family History  Problem Relation Age of Onset  . Breast cancer Paternal Aunt 14    Otherwise negative.   REVIEW OF SYSTEMS:  Review of Systems  Constitutional: Negative for chills and fever.  HENT: Negative for congestion and sore throat.   Respiratory: Negative for cough and shortness of breath.   Cardiovascular: Negative for chest pain and palpitations.  Gastrointestinal: Positive for abdominal pain, nausea and vomiting. Negative for constipation and diarrhea.  Genitourinary: Negative for dysuria and urgency.  All other systems reviewed and are negative.   VITAL SIGNS:  Temp:  [98.7 F (37.1 C)-98.9 F (37.2 C)] 98.9 F (37.2 C) (05/23 1509) Pulse Rate:  [104-113] 104 (05/23 1509) Resp:  [20] 20 (05/23 1509) BP: (143-153)/(104-107) 153/104 (05/23 1509) SpO2:  [94 %-98 %] 98 % (05/23 1509) Weight:  [82.6 kg] 82.6 kg (05/23 1052)  Height: 5\' 2"  (157.5 cm) Weight: 82.6 kg BMI (Calculated): 33.28   PHYSICAL EXAM:  Physical Exam Vitals and nursing note reviewed. Exam conducted with a chaperone present.  Constitutional:      General: She is not in acute distress.    Appearance: Normal appearance. She is obese. She is not ill-appearing.     Comments: Patient resting comfortably, husband at bedside   HENT:     Head: Normocephalic and atraumatic.  Eyes:     General: Scleral icterus present.     Conjunctiva/sclera: Conjunctivae normal.   Cardiovascular:     Rate and Rhythm: Normal rate.     Pulses: Normal pulses.     Heart sounds: No murmur heard.   Pulmonary:     Effort: Pulmonary effort is normal. No respiratory distress.  Abdominal:     General: There is no distension.     Palpations: Abdomen is soft.     Tenderness: There is abdominal tenderness in the right upper quadrant and epigastric area. There is no guarding or rebound.     Comments: Abdomen is soft, she is sore/tender mostly in RUQ and epigastrium, non-distended, no rebound/guarding  Genitourinary:    Comments: Deferred Musculoskeletal:     Right lower leg: No edema.     Left lower leg: No edema.  Skin:    General: Skin is warm and dry.     Coloration: Skin is jaundiced (Mild). Skin is not pale.     Findings: No erythema.  Neurological:     General: No focal deficit present.     Mental Status: She is alert and oriented to person, place, and time.  Psychiatric:        Mood and Affect: Mood normal.        Behavior: Behavior normal.     INTAKE/OUTPUT:  This shift: No intake/output data recorded.  Last 2 shifts: @IOLAST2SHIFTS @  Labs:  CBC Latest Ref Rng & Units 04/10/2021  WBC 4.0 - 10.5 K/uL 18.9(H)  Hemoglobin 12.0 - 15.0 g/dL  Hematocrit 04/12/2021 - 46.0 % 42.6  Platelets 150 - 400 K/uL 538(H)   CMP Latest Ref Rng & Units 04/10/2021  Glucose 70 - 99 mg/dL 66.2)  BUN 6 - 20 mg/dL 11  Creatinine 04/12/2021 - 947(M mg/dL 5.46  Sodium 5.03 - 5.46 mmol/L 138  Potassium 3.5 - 5.1 mmol/L 4.2  Chloride 98 - 111 mmol/L 99  CO2 22 - 32 mmol/L 27  Calcium 8.9 - 10.3 mg/dL 9.7  Total Protein 6.5 - 8.1 g/dL 568)  Total Bilirubin 0.3 - 1.2 mg/dL 3.9(H)  Alkaline Phos 38 - 126 U/L 72  AST 15 - 41 U/L 37  ALT 0 - 44 U/L 52(H)     Imaging studies:   RUQ 127 (04/10/2021) personally reviewed which is concerning for cholelithiasis and changes concerning for cholecystitis, and radiologist report reviewed:  IMPRESSION: Cholelithiasis with gallbladder wall  thickening suspicious for acute cholecystitis although negative sonographic Murphy's sign is elicited. HIDA scan may be helpful for further evaluation.  Fatty liver.    Assessment/Plan: (ICD-10's: K81.9) 55 y.o. female with abdominal pain, nausea, emesis found to have cholelithiasis and likely cholecystitis as well as hyperbilirubinemia/juandice, although primarily indirect   - Although her hyperbilirubinemia is mostly indirect, she does have significant jaundice on examination. I do believe it is prudent in this case to rule out choledocholithiasis with MRCP, which I have ordered  - Will plan on admission to surgery service  - At some point this  admission, she will benefit from cholecystectomy. Timing pending.    - NPO + IVF resuscitation  - IV Abx (Zosyn)  - Monitor abdominal examination  - Pain control prn; antiemetics prn  - Monitor leukocytosis  - Monitor hyperbilirubinemia   - Mobilization as toelrated   - DVT prophylaxis; hold for potential procedures  All of the above findings and recommendations were discussed with the patient (and her husband at bedside), and all of their questions were answered to their expressed satisfaction.  -- Lynden Oxford, PA-C [facility] 04/10/2021, 3:57 PM [phone] M-F: 7am - 4pm

## 2021-04-10 NOTE — Progress Notes (Deleted)
Brief Progress Note Briefly, patient with 2-3 days of nausea/emesis/abdominal pain. Work up in the Ed concerning for leukocytosis to 18K, hyperbilirubinemia to 3.9 (indirect 3.0), and [place] concerning for cholelithiasis and possible cholecystitis. On exam, she is tender in he RUQ with scleral icterus.   Although hyperbilirubinemia is primarily indirect, she is quite jaundice. I do think it would be pertinent to rule out choledocholithiasis definitively before proceeding with cholecystectomy. Will plan for MRCP, NPO, IV Abx, and admission to surgery  Full H&P to follow  -- Lynden Oxford, PA-C [facility] 04/10/2021, 3:56 PM [phone] M-F: 7am - 4pm

## 2021-04-10 NOTE — ED Notes (Signed)
Patient transported to MRI 

## 2021-04-10 NOTE — ED Notes (Signed)
Blood work available in care everywhere under results review, per Dr. Scotty Court, do not repeat blood work at this time.

## 2021-04-10 NOTE — ED Notes (Signed)
Message sent to floor nurse  

## 2021-04-11 LAB — CBC
HCT: 39.3 % (ref 36.0–46.0)
Hemoglobin: 13.5 g/dL (ref 12.0–15.0)
MCH: 30.2 pg (ref 26.0–34.0)
MCHC: 34.4 g/dL (ref 30.0–36.0)
MCV: 87.9 fL (ref 80.0–100.0)
Platelets: 490 10*3/uL — ABNORMAL HIGH (ref 150–400)
RBC: 4.47 MIL/uL (ref 3.87–5.11)
RDW: 13 % (ref 11.5–15.5)
WBC: 12.4 10*3/uL — ABNORMAL HIGH (ref 4.0–10.5)
nRBC: 0 % (ref 0.0–0.2)

## 2021-04-11 LAB — COMPREHENSIVE METABOLIC PANEL
ALT: 68 U/L — ABNORMAL HIGH (ref 0–44)
AST: 52 U/L — ABNORMAL HIGH (ref 15–41)
Albumin: 3.5 g/dL (ref 3.5–5.0)
Alkaline Phosphatase: 68 U/L (ref 38–126)
Anion gap: 10 (ref 5–15)
BUN: 10 mg/dL (ref 6–20)
CO2: 24 mmol/L (ref 22–32)
Calcium: 8.8 mg/dL — ABNORMAL LOW (ref 8.9–10.3)
Chloride: 104 mmol/L (ref 98–111)
Creatinine, Ser: 0.35 mg/dL — ABNORMAL LOW (ref 0.44–1.00)
GFR, Estimated: >60 mL/min (ref >60)
Glucose, Bld: 143 mg/dL — ABNORMAL HIGH (ref 70–99)
Potassium: 3.9 mmol/L (ref 3.5–5.1)
Sodium: 138 mmol/L (ref 135–145)
Total Bilirubin: 2.6 mg/dL — ABNORMAL HIGH (ref 0.3–1.2)
Total Protein: 7.3 g/dL (ref 6.5–8.1)

## 2021-04-11 MED ORDER — IBUPROFEN 600 MG PO TABS: 600.0000 mg | ORAL_TABLET | Freq: Four times a day (QID) | ORAL | 0 refills | Status: AC | PRN

## 2021-04-11 MED ORDER — OXYCODONE HCL 5 MG PO TABS: 5.0000 mg | ORAL_TABLET | Freq: Four times a day (QID) | ORAL | 0 refills | Status: DC | PRN

## 2021-04-11 NOTE — Progress Notes (Signed)
Mobility Specialist - Progress Note   04/11/21 1248  Mobility  Activity Ambulated in hall  Level of Assistance Independent  Assistive Device None (pushed IV POLE)  Distance Ambulated (ft) 250 ft  Mobility Ambulated independently in hallway  Mobility Response Tolerated well  Mobility performed by Mobility specialist  $Mobility charge 1 Mobility    Pt ambulated in hallway independently. No LOB. Mild dizziness upon standing that did resolve. Pt voiced very mild pain in abdomen prior to OOB, but pain does not increase with activity. Denied nausea. Denied SOB. Spouse at bedside.    Filiberto Pinks Mobility Specialist 04/11/21, 12:52 PM

## 2021-04-11 NOTE — Discharge Instructions (Signed)
In addition to included general post-operative instructions,  Diet: Resume home diet. Recommend avoiding or limiting fatty/greasy foods over the next few days/week. If you do eat these, you may (or may not) notice diarrhea. This is expected while your body adjusts to not having a gallbladder, and it typically resolves with time.   Activity: No heavy lifting >15-20 pounds (children, pets, laundry, garbage) for 4 weeks, but light activity and walking are encouraged. Do not drive or drink alcohol if taking narcotic pain medications or having pain that might distract from driving.  Wound care: 2 days after surgery (05/25), you may shower/get incision wet with soapy water and pat dry (do not rub incisions), but no baths or submerging incision underwater until follow-up. Your incisions have dermabond (skin glue) on them. This will break down in 10-14 days typically. Do not put lotions/creams/ointments on them.   Medications: Resume all home medications. For mild to moderate pain: acetaminophen (Tylenol) or ibuprofen/naproxen (if no kidney disease). Combining Tylenol with alcohol can substantially increase your risk of causing liver disease. Narcotic pain medications, if prescribed, can be used for severe pain, though may cause nausea, constipation, and drowsiness. Do not combine Tylenol and Percocet (or similar) within a 6 hour period as Percocet (and similar) contain(s) Tylenol. If you do not need the narcotic pain medication, you do not need to fill the prescription.  Call office [phone] / [phone]) at any time if any questions, worsening pain, fevers/chills, bleeding, drainage from incision site, or other concerns.

## 2021-04-11 NOTE — Plan of Care (Signed)

## 2021-04-11 NOTE — Progress Notes (Signed)
Patient discharged to home accompanied by husband with all belongings wheeled out of unit by transport.  VSS.  A+Ox4.  Room Air, minimal complaints of pain.  Abdominal surgical sites x 5, approximated, intact, ota.  Medications and discharge instructions reviewed.  All questions answered.  PIVx1 removed, no bleeding, intact.  Patient verbalized signs and symptoms of infection and agreed to follow up with all appointments as listed on AVS.  Patient satisfied with overall care at [facility].

## 2021-04-11 NOTE — Plan of Care (Signed)
  Problem: Education: Goal: Knowledge of General Education information will improve Description: Including pain rating scale, medication(s)/side effects and non-pharmacologic comfort measures 04/11/2021 1252 by Orvan Seen, RN Outcome: Completed/Met 04/11/2021 1252 by Orvan Seen, RN Outcome: Progressing   Problem: Health Behavior/Discharge Planning: Goal: Ability to manage health-related needs will improve 04/11/2021 1252 by Orvan Seen, RN Outcome: Completed/Met 04/11/2021 1252 by Orvan Seen, RN Outcome: Progressing   Problem: Clinical Measurements: Goal: Ability to maintain clinical measurements within normal limits will improve 04/11/2021 1252 by Orvan Seen, RN Outcome: Completed/Met 04/11/2021 1252 by Orvan Seen, RN Outcome: Progressing Goal: Will remain free from infection 04/11/2021 1252 by Orvan Seen, RN Outcome: Completed/Met 04/11/2021 1252 by Orvan Seen, RN Outcome: Progressing Goal: Diagnostic test results will improve 04/11/2021 1252 by Orvan Seen, RN Outcome: Completed/Met 04/11/2021 1252 by Orvan Seen, RN Outcome: Progressing Goal: Respiratory complications will improve 04/11/2021 1252 by Orvan Seen, RN Outcome: Completed/Met 04/11/2021 1252 by Orvan Seen, RN Outcome: Progressing Goal: Cardiovascular complication will be avoided 04/11/2021 1252 by Orvan Seen, RN Outcome: Completed/Met 04/11/2021 1252 by Orvan Seen, RN Outcome: Progressing   Problem: Activity: Goal: Risk for activity intolerance will decrease 04/11/2021 1252 by Orvan Seen, RN Outcome: Completed/Met 04/11/2021 1252 by Orvan Seen, RN Outcome: Progressing   Problem: Nutrition: Goal: Adequate nutrition will be maintained 04/11/2021 1252 by Orvan Seen, RN Outcome: Completed/Met 04/11/2021 1252 by Orvan Seen, RN Outcome: Progressing   Problem: Coping: Goal: Level of anxiety will decrease 04/11/2021  1252 by Orvan Seen, RN Outcome: Completed/Met 04/11/2021 1252 by Orvan Seen, RN Outcome: Progressing   Problem: Elimination: Goal: Will not experience complications related to bowel motility 04/11/2021 1252 by Orvan Seen, RN Outcome: Completed/Met 04/11/2021 1252 by Orvan Seen, RN Outcome: Progressing Goal: Will not experience complications related to urinary retention 04/11/2021 1252 by Orvan Seen, RN Outcome: Completed/Met 04/11/2021 1252 by Orvan Seen, RN Outcome: Progressing   Problem: Pain Managment: Goal: General experience of comfort will improve 04/11/2021 1252 by Orvan Seen, RN Outcome: Completed/Met 04/11/2021 1252 by Orvan Seen, RN Outcome: Progressing   Problem: Safety: Goal: Ability to remain free from injury will improve 04/11/2021 1252 by Orvan Seen, RN Outcome: Completed/Met 04/11/2021 1252 by Orvan Seen, RN Outcome: Progressing   Problem: Skin Integrity: Goal: Risk for impaired skin integrity will decrease 04/11/2021 1252 by Orvan Seen, RN Outcome: Completed/Met 04/11/2021 1252 by Orvan Seen, RN Outcome: Progressing

## 2021-04-11 NOTE — Discharge Summary (Signed)
[facility] SURGICAL DISCHARGE SUMMARY  Patient ID: Mary Cowan MRN: [id number] DOB/AGE: 1966/08/15 55 y.o.  Admit date: 04/10/2021 Discharge date: 04/11/2021  Discharge Diagnoses Patient Active Problem List   Diagnosis Date Noted  . Cholecystitis 04/10/2021    Consultants None  Procedures 04/11/2021: Robotic Assisted Laparoscopic Cholecystectomy  HPI: 55 y.o. female presented to [facility] ED todday for abdominal pain. Patient reports the acute onset of nausea and emesis over the weekend which she initially thought was food poisoning. Dinner prior to the onset was crab cakes, but her husband ate the same thing without issues. She does endorse associated upper abdominal soreness which she attributes to the the frequent vomiting, but does remain more tender in the RUQ. No fever, chills, cough, CP, SOB, urinary changes, or bowel changes. No previous history of similar. No intra-abdominal surgeries. She initially presented to [facility] clinic but was referred to the ED. Work up in the ED was concerning for leukocytosis to 18.9K, hyperbilirubinemia to 3.9 (indirect 3.0), and [place] was concerning for cholelithiasis and possible cholecystitis.   Hospital Course: Patient admitted to general surgery and underwent MRCP as precaution which was without evidence of choledocholithiasis. Informed consent was obtained and documented, and patient underwent uneventful robotic assisted laparoscopic cholecystectomy (Dr Claudine Mouton, 04/10/2021).  Post-operatively, patient's pain/symptoms improved/resolved and advancement of patient's diet and ambulation were well-tolerated. The remainder of patient's hospital course was essentially unremarkable, and discharge planning was initiated accordingly with patient safely able to be discharged home with appropriate discharge instructions, pain control, and outpatient follow-up after all of her questions were answered to her expressed satisfaction.   Discharge  Condition: Good   Physical Examination:  Constitutional: Well appearing female, NAD Pulmonary: Normal effort, no respiratory distress Gastrointestinal: Soft, incisional tenderness, non-distended, no rebound/guarding Skin: Laparoscopic incisions are CDI with dermabond, no erythema or drainage    Allergies as of 04/11/2021      Reactions   Cefuroxime Axetil    Other reaction(s): GI Intolerance, Other (See Comments) C/o having stomach flu s/s   Cetirizine Swelling   Throat was swelling   Nickel Rash   Esomeprazole Magnesium Diarrhea      Medication List    TAKE these medications   escitalopram 10 MG tablet Commonly known as: LEXAPRO Take 10 mg by mouth daily.   ibuprofen 600 MG tablet Commonly known as: ADVIL Take 1 tablet (600 mg total) by mouth every 6 (six) hours as needed.   oxyCODONE 5 MG immediate release tablet Commonly known as: Oxy IR/ROXICODONE Take 1 tablet (5 mg total) by mouth every 6 (six) hours as needed for severe pain or breakthrough pain.         Follow-up Information    Donovan Kail, PA-C. Schedule an appointment as soon as possible for a visit in 2 week(s).   Specialty: Physician Assistant Why: s/p laparoscopic cholecystectomy  Contact information: [address] [place] [address] [phone]                Time spent on discharge management including discussion of hospital course, clinical condition, outpatient instructions, prescriptions, and follow up with the patient and members of the medical team: >30 minutes  -- Lynden Oxford , PA-C [facility]  04/11/2021, 9:40 AM [phone] M-F: 7am - 4pm

## 2021-04-12 LAB — URINE CULTURE: Culture: 80000 — AB

## 2021-04-12 LAB — SURGICAL PATHOLOGY

## 2021-04-24 ENCOUNTER — Encounter: Payer: Self-pay | Admitting: Physician Assistant

## 2021-04-24 ENCOUNTER — Ambulatory Visit (INDEPENDENT_AMBULATORY_CARE_PROVIDER_SITE_OTHER): Payer: 59 | Admitting: Physician Assistant

## 2021-04-24 ENCOUNTER — Other Ambulatory Visit: Payer: Self-pay

## 2021-04-24 VITALS — BP 145/95 | HR 82 | Temp 98.2°F | Ht 62.0 in | Wt 183.0 lb

## 2021-04-24 DIAGNOSIS — K819 Cholecystitis, unspecified: Secondary | ICD-10-CM

## 2021-04-24 DIAGNOSIS — Z09 Encounter for follow-up examination after completed treatment for conditions other than malignant neoplasm: Secondary | ICD-10-CM

## 2021-04-24 NOTE — Patient Instructions (Addendum)
If you have any concerns or questions, please feel free to call our office.   GENERAL POST-OPERATIVE PATIENT INSTRUCTIONS   WOUND CARE INSTRUCTIONS:  Keep a dry clean dressing on the wound if there is drainage. The initial bandage may be removed after 24 hours.  Once the wound has quit draining you may leave it open to air.  If clothing rubs against the wound or causes irritation and the wound is not draining you may cover it with a dry dressing during the daytime.  Try to keep the wound dry and avoid ointments on the wound unless directed to do so.  If the wound becomes bright red and painful or starts to drain infected material that is not clear, please contact your physician immediately.  If the wound is mildly pink and has a thick firm ridge underneath it, this is normal, and is referred to as a healing ridge.  This will resolve over the next 4-6 weeks.  BATHING: You may shower if you have been informed of this by your surgeon. However, Please do not submerge in a tub, hot tub, or pool until incisions are completely sealed or have been told by your surgeon that you may do so.  DIET:  You may eat any foods that you can tolerate.  It is a good idea to eat a high fiber diet and take in plenty of fluids to prevent constipation.  If you do become constipated you may want to take a mild laxative or take ducolax tablets on a daily basis until your bowel habits are regular.  Constipation can be very uncomfortable, along with straining, after recent surgery.  ACTIVITY:  You are encouraged to cough and deep breath or use your incentive spirometer if you were given one, every 15-30 minutes when awake.  This will help prevent respiratory complications and low grade fevers post-operatively if you had a general anesthetic.  You may want to hug a pillow when coughing and sneezing to add additional support to the surgical area, if you had abdominal or chest surgery, which will decrease pain during these times.  You  are encouraged to walk and engage in light activity for the next two weeks.  You should not lift more than 15 pounds, until 05/08/2021 as it could put you at increased risk for complications.  Twenty pounds is roughly equivalent to a plastic bag of groceries. At that time- Listen to your body when lifting, if you have pain when lifting, stop and then try again in a few days. Soreness after doing exercises or activities of daily living is normal as you get back in to your normal routine.  MEDICATIONS:  Try to take narcotic medications and anti-inflammatory medications, such as tylenol, ibuprofen, naprosyn, etc., with food.  This will minimize stomach upset from the medication.  Should you develop nausea and vomiting from the pain medication, or develop a rash, please discontinue the medication and contact your physician.  You should not drive, make important decisions, or operate machinery when taking narcotic pain medication.  SUNBLOCK Use sun block to incision area over the next year if this area will be exposed to sun. This helps decrease scarring and will allow you avoid a permanent darkened area over your incision.   Gallbladder Eating Plan If you have a gallbladder condition, you may have trouble digesting fats. Eating a low-fat diet can help reduce your symptoms, and may be helpful before and after having surgery to remove your gallbladder (cholecystectomy). Your health care  provider may recommend that you work with a diet and nutrition specialist (dietitian) to help you reduce the amount of fat in your diet. What are tips for following this plan? General guidelines  Limit your fat intake to less than 30% of your total daily calories. If you eat around 1,800 calories each day, this is less than 60 grams (g) of fat per day.  Fat is an important part of a healthy diet. Eating a low-fat diet can make it hard to maintain a healthy body weight. Ask your dietitian how much fat, calories, and other  nutrients you need each day.  Eat small, frequent meals throughout the day instead of three large meals.  Drink at least 8-10 cups of fluid a day. Drink enough fluid to keep your urine clear or pale yellow.  Limit alcohol intake to no more than 1 drink a day for nonpregnant women and 2 drinks a day for men. One drink equals 12 oz of beer, 5 oz of wine, or 1 oz of hard liquor. Reading food labels  Check Nutrition Facts on food labels for the amount of fat per serving. Choose foods with less than 3 grams of fat per serving.   Shopping  Choose nonfat and low-fat healthy foods. Look for the words "nonfat," "low fat," or "fat free."  Avoid buying processed or prepackaged foods. Cooking  Cook using low-fat methods, such as baking, broiling, grilling, or boiling.  Cook with small amounts of healthy fats, such as olive oil, grapeseed oil, canola oil, or sunflower oil. What foods are recommended?  All fresh, frozen, or canned fruits and vegetables.  Whole grains.  Low-fat or non-fat (skim) milk and yogurt.  Lean meat, skinless poultry, fish, eggs, and beans.  Low-fat protein supplement powders or drinks.  Spices and herbs. What foods are not recommended?  High-fat foods. These include baked goods, fast food, fatty cuts of meat, ice cream, french toast, sweet rolls, pizza, cheese bread, foods covered with butter, creamy sauces, or cheese.  Fried foods. These include french fries, tempura, battered fish, breaded chicken, fried breads, and sweets.  Foods with strong odors.  Foods that cause bloating and gas. Summary  A low-fat diet can be helpful if you have a gallbladder condition, or before and after gallbladder surgery.  Limit your fat intake to less than 30% of your total daily calories. This is about 60 g of fat if you eat 1,800 calories each day.  Eat small, frequent meals throughout the day instead of three large meals. This information is not intended to replace advice  given to you by your health care provider. Make sure you discuss any questions you have with your health care provider. Document Revised: 06/23/2020 Document Reviewed: 06/23/2020 Elsevier Patient Education  2021 [employer].

## 2021-04-24 NOTE — Progress Notes (Signed)
[facility] POST-OP OFFICE VISIT  04/24/2021  HPI: Mary Cowan is a 55 y.o. female 13 days s/p robotic assisted laparoscopic cholecystectomy for acute cholecystitis with Dr Claudine Mouton  She is overall doing very well No longer with abdominal pain No nausea, emesis, fever, chills, or diarrhea She is slowly re-introducing fatty foods to her diet No issues with the incisions No other complaints.   Vital signs: BP (!) 145/95   Pulse 82   Temp 98.2 F (36.8 C) (Oral)   Ht 5\' 2"  (1.575 m)   Wt 183 lb (83 kg)   SpO2 98%   BMI 33.47 kg/m    Physical Exam: Constitutional: Well appearing female, NAD Abdomen: Soft, non-tender, non-distended, no rebound/guarding Skin: Laparoscopic incisions have healed well, no erythema or derange   Assessment/Plan: This is a 55 y.o. female 13 days s/p robotic assisted laparoscopic cholecystectomy for acute cholecystitis   - Pain control prn; OTC medications  - Reviewed wound care  - Reviewed lifting restrictions: 4 weeks total  - Reviewed surgical pathology  - She can return to surgery clinic on as needed basis   -- 42, PA-C [facility] 04/24/2021, 10:12 AM [phone] M-F: 7am - 4pm

## 2021-04-27 ENCOUNTER — Ambulatory Visit: Payer: Self-pay | Admitting: *Deleted

## 2021-04-27 NOTE — Telephone Encounter (Signed)
Reason for Disposition  [1] MODERATE pain (e.g., interferes with normal activities) AND [2] pain comes and goes (cramps) AND [3] present > 24 hours  (Exception: pain with Vomiting or Diarrhea - see that Guideline)  Answer Assessment - Initial Assessment Questions 1. LOCATION: "Where does it hurt?"      Left side, way to left, under rib area 2. RADIATION: "Does the pain shoot anywhere else?" (e.g., chest, back)     No 3. ONSET: "When did the pain begin?" (e.g., minutes, hours or days ago)    Ast night sfter dinner 4. SUDDEN: "Gradual or sudden onset?"     Suddenly 5. PATTERN "Does the pain come and go, or is it constant?"    - If constant: "Is it getting better, staying the same, or worsening?"      (Note: Constant means the pain never goes away completely; most serious pain is constant and it progresses)     - If intermittent: "How long does it last?" "Do you have pain now?"     (Note: Intermittent means the pain goes away completely between bouts)     With deep breathing 6. SEVERITY: "How bad is the pain?"  (e.g., Scale 1-10; mild, moderate, or severe)   - MILD (1-3): doesn't interfere with normal activities, abdomen soft and not tender to touch    - MODERATE (4-7): interferes with normal activities or awakens from sleep, abdomen tender to touch    - SEVERE (8-10): excruciating pain, doubled over, unable to do any normal activities     2-3/10 7. RECURRENT SYMPTOM: "Have you ever had this type of stomach pain before?" If Yes, ask: "When was the last time?" and "What happened that time?"    no 8. CAUSE: "What do you think is causing the stomach pain?"   GB removed 04/10/21 9. RELIEVING/AGGRAVATING FACTORS: "What makes it better or worse?" (e.g., movement, antacids, bowel movement)    no 10. OTHER SYMPTOMS: "Do you have any other symptoms?" (e.g., back pain, diarrhea, fever, urination pain, vomiting)      no  Protocols used: Abdominal Pain - Female-A-AH

## 2021-04-27 NOTE — Telephone Encounter (Signed)
Pt reports Laparoscopic Cholecystectomy 04/10/21. States after dinner last night noted sharp pain left side of abdomen, ''Way to the left under my ribs." States intermittent, worsens with deep breathing. Denies nausea vomiting, diarrhea. Rates at 2-3/10 when occurs. Had episode Tuesday night, resolved on own, "This seems to be staying." Advised to alert surgeon, advised ED if worsens, SOB.Pt states will call surgeon. Also advised to [facility] if needed. Pt verbalizes understanding.

## 2021-04-28 ENCOUNTER — Ambulatory Visit
Admission: RE | Admit: 2021-04-28 | Discharge: 2021-04-28 | Disposition: A | Discharge status: Home / Self Care | Payer: 59 | Source: Ambulatory Visit | Attending: Physician Assistant | Admitting: Physician Assistant

## 2021-04-28 ENCOUNTER — Encounter: Payer: Self-pay | Admitting: Physician Assistant

## 2021-04-28 ENCOUNTER — Ambulatory Visit (INDEPENDENT_AMBULATORY_CARE_PROVIDER_SITE_OTHER): Payer: 59 | Admitting: Physician Assistant

## 2021-04-28 ENCOUNTER — Ambulatory Visit
Admission: RE | Admit: 2021-04-28 | Discharge: 2021-04-28 | Disposition: A | Discharge status: Home / Self Care | Payer: 59 | Attending: Physician Assistant | Admitting: Physician Assistant

## 2021-04-28 ENCOUNTER — Other Ambulatory Visit: Payer: Self-pay

## 2021-04-28 VITALS — BP 156/107 | HR 74 | Temp 98.0°F | Ht 62.0 in | Wt 180.8 lb

## 2021-04-28 DIAGNOSIS — R109 Unspecified abdominal pain: Secondary | ICD-10-CM

## 2021-04-28 DIAGNOSIS — R0781 Pleurodynia: Secondary | ICD-10-CM

## 2021-04-28 DIAGNOSIS — Z09 Encounter for follow-up examination after completed treatment for conditions other than malignant neoplasm: Secondary | ICD-10-CM

## 2021-04-28 NOTE — Addendum Note (Signed)
Addended by: Mancel Bale on: 04/28/2021 11:59 AM   Modules accepted: Orders

## 2021-04-28 NOTE — Patient Instructions (Addendum)
You may go to Radiology to get chest xray. You do not need an appointment.   If you have any concerns or questions, please feel free to call our office.    GENERAL POST-OPERATIVE PATIENT INSTRUCTIONS   WOUND CARE INSTRUCTIONS:  Keep a dry clean dressing on the wound if there is drainage. The initial bandage may be removed after 24 hours.  Once the wound has quit draining you may leave it open to air.  If clothing rubs against the wound or causes irritation and the wound is not draining you may cover it with a dry dressing during the daytime.  Try to keep the wound dry and avoid ointments on the wound unless directed to do so.  If the wound becomes bright red and painful or starts to drain infected material that is not clear, please contact your physician immediately.  If the wound is mildly pink and has a thick firm ridge underneath it, this is normal, and is referred to as a healing ridge.  This will resolve over the next 4-6 weeks.  BATHING: You may shower if you have been informed of this by your surgeon. However, Please do not submerge in a tub, hot tub, or pool until incisions are completely sealed or have been told by your surgeon that you may do so.  DIET:  You may eat any foods that you can tolerate.  It is a good idea to eat a high fiber diet and take in plenty of fluids to prevent constipation.  If you do become constipated you may want to take a mild laxative or take ducolax tablets on a daily basis until your bowel habits are regular.  Constipation can be very uncomfortable, along with straining, after recent surgery.  ACTIVITY:  You are encouraged to cough and deep breath or use your incentive spirometer if you were given one, every 15-30 minutes when awake.  This will help prevent respiratory complications and low grade fevers post-operatively if you had a general anesthetic.  You may want to hug a pillow when coughing and sneezing to add additional support to the surgical area, if you  had abdominal or chest surgery, which will decrease pain during these times.  You are encouraged to walk and engage in light activity for the next two weeks.  You should not lift more than 15 pounds, until 05/08/2021 as it could put you at increased risk for complications.  Twenty pounds is roughly equivalent to a plastic bag of groceries. At that time- Listen to your body when lifting, if you have pain when lifting, stop and then try again in a few days. Soreness after doing exercises or activities of daily living is normal as you get back in to your normal routine.  MEDICATIONS:  Try to take narcotic medications and anti-inflammatory medications, such as tylenol, ibuprofen, naprosyn, etc., with food.  This will minimize stomach upset from the medication.  Should you develop nausea and vomiting from the pain medication, or develop a rash, please discontinue the medication and contact your physician.  You should not drive, make important decisions, or operate machinery when taking narcotic pain medication.  SUNBLOCK Use sun block to incision area over the next year if this area will be exposed to sun. This helps decrease scarring and will allow you avoid a permanent darkened area over your incision.    Gallbladder Eating Plan If you have a gallbladder condition, you may have trouble digesting fats. Eating a low-fat diet can help reduce  your symptoms, and may be helpful before and after having surgery to remove your gallbladder (cholecystectomy). Your health care provider may recommend that you work with a diet and nutrition specialist (dietitian) to help you reduce the amount of fat in your diet. What are tips for following this plan? General guidelines Limit your fat intake to less than 30% of your total daily calories. If you eat around 1,800 calories each day, this is less than 60 grams (g) of fat per day. Fat is an important part of a healthy diet. Eating a low-fat diet can make it hard to maintain  a healthy body weight. Ask your dietitian how much fat, calories, and other nutrients you need each day. Eat small, frequent meals throughout the day instead of three large meals. Drink at least 8-10 cups of fluid a day. Drink enough fluid to keep your urine clear or pale yellow. Limit alcohol intake to no more than 1 drink a day for nonpregnant women and 2 drinks a day for men. One drink equals 12 oz of beer, 5 oz of wine, or 1 oz of hard liquor. Reading food labels  Check Nutrition Facts on food labels for the amount of fat per serving. Choose foods with less than 3 grams of fat per serving.  Shopping Choose nonfat and low-fat healthy foods. Look for the words "nonfat," "low fat," or "fat free." Avoid buying processed or prepackaged foods. Cooking Cook using low-fat methods, such as baking, broiling, grilling, or boiling. Cook with small amounts of healthy fats, such as olive oil, grapeseed oil, canola oil, or sunflower oil. What foods are recommended? All fresh, frozen, or canned fruits and vegetables. Whole grains. Low-fat or non-fat (skim) milk and yogurt. Lean meat, skinless poultry, fish, eggs, and beans. Low-fat protein supplement powders or drinks. Spices and herbs. What foods are not recommended? High-fat foods. These include baked goods, fast food, fatty cuts of meat, ice cream, french toast, sweet rolls, pizza, cheese bread, foods covered with butter, creamy sauces, or cheese. Fried foods. These include french fries, tempura, battered fish, breaded chicken, fried breads, and sweets. Foods with strong odors. Foods that cause bloating and gas. Summary A low-fat diet can be helpful if you have a gallbladder condition, or before and after gallbladder surgery. Limit your fat intake to less than 30% of your total daily calories. This is about 60 g of fat if you eat 1,800 calories each day. Eat small, frequent meals throughout the day instead of three large meals. This information  is not intended to replace advice given to you by your health care provider. Make sure you discuss any questions you have with your healthcare provider. Document Revised: 06/23/2020 Document Reviewed: 06/23/2020 Elsevier Patient Education  2022 [employer].

## 2021-04-28 NOTE — Progress Notes (Signed)
[facility] POST-OP OFFICE VISIT  04/28/2021  HPI: Mary Cowan is a 55 y.o. female 17 days s/p robotic assisted laparoscopic cholecystectomy for acute cholecystitis with Dr Claudine Mouton  She had been doing great at home; however, on on Tuesday night she noticed the acute onset of a "sharp twinge" of pain in her left lateral upper abdomen almost near her ribs. This has come and gone intermittently since the onset. At the worst this is a 3/10, sharp in nature, and exacerbated with deep inspiration. She is sore in the area but pain is not grossly reproducible. She tried Advil once for this. Otherwise, no fever, chills, cough, SOB, CP, nausea, emesis, urinary symptoms, or bowel changes. No injury or trauma prior to the onset. No history of similar.   Vital signs: BP (!) 156/107   Pulse 74   Temp 98 F (36.7 C) (Oral)   Ht 5\' 2"  (1.575 m)   Wt 180 lb 12.8 oz (82 kg)   SpO2 98%   BMI 33.07 kg/m    Physical Exam: Constitutional: Well appearing female, NAD Pulmonary: Normal respiratory effort, no respiratory distress Chest: She seems to be tender over her 11th - 12th ribs, no overt injury, pain is not grossly reproducible Abdomen: Soft, non-tender, non-distended, no rebound/guarding Skin: Laparoscopic incisions are CDI, well healed  Assessment/Plan: This is a 55 y.o. female 17 days s/p robotic assisted laparoscopic cholecystectomy for acute cholecystitis   - Unsure etiology of her new left lateral abdominal/chest pain. I suspect this is MSK in nature as she is without any other symptoms/injury. I will obtain CXR as a precaution. Encouraged her to use scheduled NSAIDs to see if this helps. Encouraged her to follow up with her PCP as I doubt this is related to her cholecystectomy   - We will be available as needed   -- 42, PA-C [facility] 04/28/2021, 11:42 AM [phone] M-F: 7am - 4pm

## 2021-06-02 ENCOUNTER — Other Ambulatory Visit: Payer: Self-pay | Admitting: Student

## 2021-06-02 DIAGNOSIS — Z1231 Encounter for screening mammogram for malignant neoplasm of breast: Secondary | ICD-10-CM

## 2021-06-14 ENCOUNTER — Ambulatory Visit
Admission: RE | Admit: 2021-06-14 | Discharge: 2021-06-14 | Disposition: A | Discharge status: Home / Self Care | Payer: 59 | Source: Ambulatory Visit | Attending: Student | Admitting: Student

## 2021-06-14 ENCOUNTER — Other Ambulatory Visit: Payer: Self-pay

## 2021-06-14 DIAGNOSIS — Z1231 Encounter for screening mammogram for malignant neoplasm of breast: Secondary | ICD-10-CM | POA: Diagnosis not present

## 2021-06-16 ENCOUNTER — Other Ambulatory Visit: Payer: Self-pay | Admitting: Student

## 2021-06-20 ENCOUNTER — Other Ambulatory Visit: Payer: Self-pay | Admitting: Student

## 2021-06-21 ENCOUNTER — Other Ambulatory Visit: Payer: Self-pay | Admitting: Family

## 2021-06-21 ENCOUNTER — Other Ambulatory Visit: Payer: Self-pay | Admitting: Student

## 2021-06-21 DIAGNOSIS — N6489 Other specified disorders of breast: Secondary | ICD-10-CM

## 2021-06-21 DIAGNOSIS — R928 Other abnormal and inconclusive findings on diagnostic imaging of breast: Secondary | ICD-10-CM

## 2021-06-26 ENCOUNTER — Other Ambulatory Visit: Payer: Self-pay

## 2021-06-26 ENCOUNTER — Ambulatory Visit
Admission: RE | Admit: 2021-06-26 | Discharge: 2021-06-26 | Disposition: A | Discharge status: Home / Self Care | Payer: 59 | Source: Ambulatory Visit | Attending: Student | Admitting: Student

## 2021-06-26 DIAGNOSIS — N6489 Other specified disorders of breast: Secondary | ICD-10-CM | POA: Insufficient documentation

## 2021-06-26 DIAGNOSIS — R928 Other abnormal and inconclusive findings on diagnostic imaging of breast: Secondary | ICD-10-CM | POA: Insufficient documentation

## 2021-06-28 ENCOUNTER — Other Ambulatory Visit: Payer: Self-pay | Admitting: Student

## 2021-06-28 DIAGNOSIS — R928 Other abnormal and inconclusive findings on diagnostic imaging of breast: Secondary | ICD-10-CM

## 2021-06-28 DIAGNOSIS — N6310 Unspecified lump in the right breast, unspecified quadrant: Secondary | ICD-10-CM

## 2022-03-08 ENCOUNTER — Other Ambulatory Visit: Payer: 59

## 2022-03-16 ENCOUNTER — Ambulatory Visit
Admission: RE | Admit: 2022-03-16 | Discharge: 2022-03-16 | Disposition: A | Discharge status: Home / Self Care | Payer: 59 | Source: Ambulatory Visit | Attending: Student | Admitting: Student

## 2022-03-16 DIAGNOSIS — N6310 Unspecified lump in the right breast, unspecified quadrant: Secondary | ICD-10-CM | POA: Insufficient documentation

## 2022-03-16 DIAGNOSIS — R928 Other abnormal and inconclusive findings on diagnostic imaging of breast: Secondary | ICD-10-CM | POA: Diagnosis present

## 2022-11-05 ENCOUNTER — Encounter: Payer: Self-pay | Admitting: Student

## 2022-11-06 ENCOUNTER — Other Ambulatory Visit: Payer: Self-pay | Admitting: Student

## 2022-11-06 DIAGNOSIS — N63 Unspecified lump in unspecified breast: Secondary | ICD-10-CM

## 2022-11-27 ENCOUNTER — Other Ambulatory Visit: Payer: 59

## 2022-12-06 ENCOUNTER — Ambulatory Visit
Admission: RE | Admit: 2022-12-06 | Discharge: 2022-12-06 | Disposition: A | Discharge status: Home / Self Care | Payer: 59 | Source: Ambulatory Visit | Attending: Student | Admitting: Student

## 2022-12-06 DIAGNOSIS — N63 Unspecified lump in unspecified breast: Secondary | ICD-10-CM | POA: Diagnosis not present

## 2024-05-25 ENCOUNTER — Encounter: Payer: Self-pay | Admitting: Student

## 2024-06-18 ENCOUNTER — Other Ambulatory Visit: Payer: Self-pay | Admitting: Student

## 2024-06-18 DIAGNOSIS — N63 Unspecified lump in unspecified breast: Secondary | ICD-10-CM

## 2024-06-23 ENCOUNTER — Ambulatory Visit
Admission: RE | Admit: 2024-06-23 | Discharge: 2024-06-23 | Disposition: A | Discharge status: Home / Self Care | Source: Ambulatory Visit | Attending: Student | Admitting: Student

## 2024-06-23 DIAGNOSIS — N63 Unspecified lump in unspecified breast: Secondary | ICD-10-CM | POA: Diagnosis present

## 2024-06-24 ENCOUNTER — Other Ambulatory Visit: Payer: Self-pay | Admitting: Student

## 2024-06-24 DIAGNOSIS — R928 Other abnormal and inconclusive findings on diagnostic imaging of breast: Secondary | ICD-10-CM

## 2024-06-26 ENCOUNTER — Ambulatory Visit
Admission: RE | Admit: 2024-06-26 | Discharge: 2024-06-26 | Disposition: A | Discharge status: Home / Self Care | Source: Ambulatory Visit | Attending: Student | Admitting: Student

## 2024-06-26 DIAGNOSIS — R928 Other abnormal and inconclusive findings on diagnostic imaging of breast: Secondary | ICD-10-CM | POA: Insufficient documentation

## 2024-06-26 MED ORDER — LIDOCAINE HCL 1 % IJ SOLN
5.0000 mL | Freq: Once | INTRAMUSCULAR | Status: AC
  Administered 2024-06-26: 5 mL
  Filled 2024-06-26: qty 5
